# Patient Record
Sex: Male | Born: 1970 | Hispanic: No | Marital: Single | Smoking: Current some day smoker
Health system: Southern US, Community
[De-identification: ages and names within clinical notes are randomized; demographics above are authoritative.]

## PROBLEM LIST (undated history)

## (undated) DIAGNOSIS — A0100 Typhoid fever, unspecified: Secondary | ICD-10-CM

## (undated) DIAGNOSIS — B20 Human immunodeficiency virus [HIV] disease: Secondary | ICD-10-CM

## (undated) DIAGNOSIS — S66929A Laceration of unspecified muscle, fascia and tendon at wrist and hand level, unspecified hand, initial encounter: Secondary | ICD-10-CM

## (undated) DIAGNOSIS — J189 Pneumonia, unspecified organism: Secondary | ICD-10-CM

## (undated) DIAGNOSIS — S61419A Laceration without foreign body of unspecified hand, initial encounter: Secondary | ICD-10-CM

## (undated) DIAGNOSIS — I209 Angina pectoris, unspecified: Secondary | ICD-10-CM

## (undated) DIAGNOSIS — Z21 Asymptomatic human immunodeficiency virus [HIV] infection status: Secondary | ICD-10-CM

## (undated) HISTORY — DX: Human immunodeficiency virus (HIV) disease: B20

## (undated) HISTORY — DX: Asymptomatic human immunodeficiency virus (hiv) infection status: Z21

---

## 2000-12-22 DIAGNOSIS — S61419A Laceration without foreign body of unspecified hand, initial encounter: Secondary | ICD-10-CM

## 2000-12-22 HISTORY — DX: Laceration without foreign body of unspecified hand, initial encounter: S61.419A

## 2011-10-27 ENCOUNTER — Other Ambulatory Visit (HOSPITAL_COMMUNITY): Payer: Self-pay | Admitting: Respiratory Therapy

## 2011-10-27 ENCOUNTER — Emergency Department (HOSPITAL_COMMUNITY): Payer: Medicaid Other

## 2011-10-27 ENCOUNTER — Encounter: Payer: Self-pay | Admitting: Emergency Medicine

## 2011-10-27 ENCOUNTER — Inpatient Hospital Stay (HOSPITAL_COMMUNITY)
Admission: EM | Admit: 2011-10-27 | Discharge: 2011-11-07 | DRG: 974 | Disposition: A | Discharge status: Home / Self Care | Payer: Medicaid Other | Attending: Internal Medicine | Admitting: Internal Medicine

## 2011-10-27 DIAGNOSIS — J80 Acute respiratory distress syndrome: Secondary | ICD-10-CM | POA: Diagnosis present

## 2011-10-27 DIAGNOSIS — B20 Human immunodeficiency virus [HIV] disease: Principal | ICD-10-CM | POA: Diagnosis present

## 2011-10-27 DIAGNOSIS — E871 Hypo-osmolality and hyponatremia: Secondary | ICD-10-CM | POA: Diagnosis present

## 2011-10-27 DIAGNOSIS — J9600 Acute respiratory failure, unspecified whether with hypoxia or hypercapnia: Secondary | ICD-10-CM | POA: Diagnosis present

## 2011-10-27 DIAGNOSIS — R197 Diarrhea, unspecified: Secondary | ICD-10-CM | POA: Diagnosis not present

## 2011-10-27 DIAGNOSIS — R509 Fever, unspecified: Secondary | ICD-10-CM

## 2011-10-27 DIAGNOSIS — R112 Nausea with vomiting, unspecified: Secondary | ICD-10-CM | POA: Diagnosis present

## 2011-10-27 DIAGNOSIS — J13 Pneumonia due to Streptococcus pneumoniae: Secondary | ICD-10-CM

## 2011-10-27 DIAGNOSIS — B59 Pneumocystosis: Secondary | ICD-10-CM

## 2011-10-27 DIAGNOSIS — R6520 Severe sepsis without septic shock: Secondary | ICD-10-CM

## 2011-10-27 DIAGNOSIS — R6521 Severe sepsis with septic shock: Secondary | ICD-10-CM

## 2011-10-27 DIAGNOSIS — I959 Hypotension, unspecified: Secondary | ICD-10-CM | POA: Diagnosis present

## 2011-10-27 DIAGNOSIS — A419 Sepsis, unspecified organism: Secondary | ICD-10-CM

## 2011-10-27 DIAGNOSIS — J189 Pneumonia, unspecified organism: Secondary | ICD-10-CM

## 2011-10-27 DIAGNOSIS — E46 Unspecified protein-calorie malnutrition: Secondary | ICD-10-CM | POA: Diagnosis present

## 2011-10-27 HISTORY — DX: Typhoid fever, unspecified: A01.00

## 2011-10-27 HISTORY — DX: Angina pectoris, unspecified: I20.9

## 2011-10-27 HISTORY — DX: Laceration without foreign body of unspecified hand, initial encounter: S61.419A

## 2011-10-27 HISTORY — DX: Pneumonia, unspecified organism: J18.9

## 2011-10-27 HISTORY — PX: OTHER SURGICAL HISTORY: SHX169

## 2011-10-27 HISTORY — DX: Laceration of unspecified muscle, fascia and tendon at wrist and hand level, unspecified hand, initial encounter: S66.929A

## 2011-10-27 LAB — POCT I-STAT 3, ART BLOOD GAS (G3+)
Acid-base deficit: 9 mmol/L — ABNORMAL HIGH (ref 0.0–2.0)
Bicarbonate: 18 mEq/L — ABNORMAL LOW (ref 20.0–24.0)
Bicarbonate: 18 mEq/L — ABNORMAL LOW (ref 20.0–24.0)
Bicarbonate: 17.3 mEq/L — ABNORMAL LOW (ref 20.0–24.0)
Patient temperature: 97.8
TCO2: 20 mmol/L (ref 0–100)
TCO2: 19 mmol/L (ref 0–100)
TCO2: 18 mmol/L (ref 0–100)
pCO2 arterial: 47.9 mmHg — ABNORMAL HIGH (ref 35.0–45.0)
pCO2 arterial: 38.7 mmHg (ref 35.0–45.0)
pH, Arterial: 7.364 (ref 7.350–7.450)
pH, Arterial: 7.206 — ABNORMAL LOW (ref 7.350–7.450)
pH, Arterial: 7.274 — ABNORMAL LOW (ref 7.350–7.450)
pO2, Arterial: 85 mmHg (ref 80.0–100.0)
pO2, Arterial: 105 mmHg — ABNORMAL HIGH (ref 80.0–100.0)
pO2, Arterial: 94 mmHg (ref 80.0–100.0)

## 2011-10-27 LAB — URINALYSIS, ROUTINE W REFLEX MICROSCOPIC
Bilirubin Urine: NEGATIVE
Glucose, UA: NEGATIVE mg/dL
Hgb urine dipstick: NEGATIVE
Ketones, ur: NEGATIVE mg/dL
Leukocytes, UA: NEGATIVE
Nitrite: NEGATIVE
Protein, ur: 30 mg/dL — AB
Specific Gravity, Urine: 1.011 (ref 1.005–1.030)
Urobilinogen, UA: 0.2 mg/dL (ref 0.0–1.0)
pH: 6 (ref 5.0–8.0)
pH: 6 (ref 5.0–8.0)

## 2011-10-27 LAB — BASIC METABOLIC PANEL
BUN: 6 mg/dL (ref 6–23)
CO2: 21 mEq/L (ref 19–32)
Chloride: 110 mEq/L (ref 96–112)
Chloride: 104 mEq/L (ref 96–112)
Creatinine, Ser: 0.6 mg/dL (ref 0.50–1.35)
Creatinine, Ser: 0.43 mg/dL — ABNORMAL LOW (ref 0.50–1.35)
GFR calc Af Amer: >90 mL/min (ref >90)
GFR calc non Af Amer: >90 mL/min (ref >90)
Glucose, Bld: 306 mg/dL — ABNORMAL HIGH (ref 70–99)
Potassium: 3.7 mEq/L (ref 3.5–5.1)
Potassium: 4.4 mEq/L (ref 3.5–5.1)

## 2011-10-27 LAB — LACTIC ACID, PLASMA: Lactic Acid, Venous: 0.8 mmol/L (ref 0.5–2.2)

## 2011-10-27 LAB — DIFFERENTIAL
Basophils Absolute: 0 10*3/uL (ref 0.0–0.1)
Basophils Relative: 0 % (ref 0–1)
Eosinophils Absolute: 0 10*3/uL (ref 0.0–0.7)
Lymphocytes Relative: 5 % — ABNORMAL LOW (ref 12–46)
Monocytes Absolute: 0.1 10*3/uL (ref 0.1–1.0)
Monocytes Relative: 3 % (ref 3–12)
Neutro Abs: 5.6 10*3/uL (ref 1.7–7.7)
Neutrophils Relative %: 88 % — ABNORMAL HIGH (ref 43–77)
Neutrophils Relative %: 94 % — ABNORMAL HIGH (ref 43–77)

## 2011-10-27 LAB — GLUCOSE, CAPILLARY
Glucose-Capillary: 99 mg/dL (ref 70–99)
Glucose-Capillary: 135 mg/dL — ABNORMAL HIGH (ref 70–99)

## 2011-10-27 LAB — COMPREHENSIVE METABOLIC PANEL
Albumin: 1.6 g/dL — ABNORMAL LOW (ref 3.5–5.2)
Alkaline Phosphatase: 60 U/L (ref 39–117)
BUN: 16 mg/dL (ref 6–23)
Creatinine, Ser: 0.82 mg/dL (ref 0.50–1.35)
Potassium: 4.4 mEq/L (ref 3.5–5.1)
Total Protein: 7.7 g/dL (ref 6.0–8.3)

## 2011-10-27 LAB — CARBOXYHEMOGLOBIN
Carboxyhemoglobin: 0.7 % (ref 0.5–1.5)
O2 Saturation: 76.8 %

## 2011-10-27 LAB — OSMOLALITY: Osmolality: 284 mOsm/kg (ref 275–300)

## 2011-10-27 LAB — HIV ANTIBODY (ROUTINE TESTING W REFLEX): HIV: REACTIVE — AB

## 2011-10-27 LAB — APTT: aPTT: 34 seconds (ref 24–37)

## 2011-10-27 LAB — CBC
HCT: 27.9 % — ABNORMAL LOW (ref 39.0–52.0)
Hemoglobin: 11.5 g/dL — ABNORMAL LOW (ref 13.0–17.0)
MCH: 31.2 pg (ref 26.0–34.0)
MCHC: 35.9 g/dL (ref 30.0–36.0)
RDW: 13.5 % (ref 11.5–15.5)
WBC: 6 10*3/uL (ref 4.0–10.5)

## 2011-10-27 LAB — URINE MICROSCOPIC-ADD ON

## 2011-10-27 LAB — PROTIME-INR: Prothrombin Time: 19.7 seconds — ABNORMAL HIGH (ref 11.6–15.2)

## 2011-10-27 LAB — MRSA PCR SCREENING: MRSA by PCR: NEGATIVE

## 2011-10-27 LAB — PROCALCITONIN: Procalcitonin: 5.01 ng/mL

## 2011-10-27 LAB — STREP PNEUMONIAE URINARY ANTIGEN: Strep Pneumo Urinary Antigen: NEGATIVE

## 2011-10-27 LAB — PNEUMOCYSTIS JIROVECI SMEAR BY DFA: Pneumocystis jiroveci Ag: POSITIVE

## 2011-10-27 MED ORDER — BIOTENE DRY MOUTH MT LIQD
15.0000 mL | Freq: Four times a day (QID) | OROMUCOSAL | Status: DC
  Administered 2011-10-27 – 2011-10-30 (×14): 15 mL via OROMUCOSAL

## 2011-10-27 MED ORDER — MIDAZOLAM HCL 2 MG/2ML IJ SOLN
INTRAMUSCULAR | Status: AC
  Administered 2011-10-27: 2 mg via INTRAVENOUS
  Filled 2011-10-27: qty 4

## 2011-10-27 MED ORDER — PANTOPRAZOLE SODIUM 40 MG IV SOLR
40.0000 mg | Freq: Every day | INTRAVENOUS | Status: DC
  Administered 2011-10-27 – 2011-10-28 (×2): 40 mg via INTRAVENOUS
  Filled 2011-10-27 (×3): qty 40

## 2011-10-27 MED ORDER — VECURONIUM BROMIDE 10 MG IV SOLR
0.8000 ug/kg/min | INTRAVENOUS | Status: DC
  Administered 2011-10-27: 0.824 ug/kg/min via INTRAVENOUS
  Filled 2011-10-27: qty 100

## 2011-10-27 MED ORDER — FOLIC ACID 5 MG/ML IJ SOLN
1.0000 mg | Freq: Every day | INTRAMUSCULAR | Status: DC
  Administered 2011-10-27 – 2011-10-30 (×4): 1 mg via INTRAVENOUS
  Filled 2011-10-27 (×4): qty 0.2

## 2011-10-27 MED ORDER — HEPARIN SODIUM (PORCINE) 5000 UNIT/ML IJ SOLN: 5000.0000 [IU] | Freq: Three times a day (TID) | INTRAMUSCULAR | Status: DC

## 2011-10-27 MED ORDER — DOBUTAMINE-DEXTROSE 2-5 MG/ML-% IV SOLN
2.5000 ug/kg/min | INTRAVENOUS | Status: DC | PRN
  Filled 2011-10-27: qty 250

## 2011-10-27 MED ORDER — ACETAMINOPHEN 325 MG PO TABS
650.0000 mg | ORAL_TABLET | Freq: Once | ORAL | Status: AC
  Administered 2011-10-27: 650 mg via ORAL
  Filled 2011-10-27: qty 2

## 2011-10-27 MED ORDER — PIPERACILLIN-TAZOBACTAM 3.375 G IVPB
3.3750 g | Freq: Once | INTRAVENOUS | Status: AC
  Administered 2011-10-27: 3.375 g via INTRAVENOUS
  Filled 2011-10-27: qty 50

## 2011-10-27 MED ORDER — NOREPINEPHRINE BITARTRATE 1 MG/ML IJ SOLN
2.0000 ug/min | INTRAMUSCULAR | Status: DC | PRN
  Administered 2011-10-27: 10 ug/min via INTRAVENOUS
  Administered 2011-10-27: 8 ug/min via INTRAVENOUS
  Administered 2011-10-27: 5 ug/min via INTRAVENOUS
  Administered 2011-10-27: 10 ug/min via INTRAVENOUS
  Filled 2011-10-27: qty 4

## 2011-10-27 MED ORDER — SODIUM CHLORIDE 0.9 % IV SOLN
10.0000 ug/h | Freq: Once | INTRAVENOUS | Status: DC
  Filled 2011-10-27: qty 50

## 2011-10-27 MED ORDER — SODIUM CHLORIDE 0.9 % IV SOLN
250.0000 mL | INTRAVENOUS | Status: DC | PRN
  Administered 2011-10-27: 500 mL via INTRAVENOUS

## 2011-10-27 MED ORDER — METHYLPREDNISOLONE SODIUM SUCC 40 MG IJ SOLR
40.0000 mg | Freq: Two times a day (BID) | INTRAMUSCULAR | Status: DC
  Administered 2011-10-27: 40 mg via INTRAVENOUS
  Filled 2011-10-27 (×2): qty 1

## 2011-10-27 MED ORDER — SODIUM CHLORIDE 0.9 % IV SOLN
Freq: Once | INTRAVENOUS | Status: AC
  Administered 2011-10-27 (×7): via INTRAVENOUS

## 2011-10-27 MED ORDER — HEPARIN SODIUM (PORCINE) 5000 UNIT/ML IJ SOLN
5000.0000 [IU] | Freq: Three times a day (TID) | INTRAMUSCULAR | Status: DC
  Administered 2011-10-27 – 2011-11-07 (×34): 5000 [IU] via SUBCUTANEOUS
  Filled 2011-10-27 (×37): qty 1

## 2011-10-27 MED ORDER — VANCOMYCIN HCL IN DEXTROSE 1-5 GM/200ML-% IV SOLN
1000.0000 mg | Freq: Once | INTRAVENOUS | Status: AC
  Administered 2011-10-27: 1000 mg via INTRAVENOUS
  Filled 2011-10-27: qty 200

## 2011-10-27 MED ORDER — ROCURONIUM BROMIDE 50 MG/5ML IV SOLN
INTRAVENOUS | Status: AC
  Filled 2011-10-27: qty 2

## 2011-10-27 MED ORDER — NOREPINEPHRINE BITARTRATE 1 MG/ML IJ SOLN
2.0000 ug/min | INTRAVENOUS | Status: DC
  Filled 2011-10-27: qty 16

## 2011-10-27 MED ORDER — DOBUTAMINE HCL 250 MG/20ML IV SOLN
2.5000 ug/kg/min | INTRAVENOUS | Status: DC
  Filled 2011-10-27: qty 80

## 2011-10-27 MED ORDER — SODIUM CHLORIDE 0.9 % IV SOLN
1.0000 mg/h | Freq: Once | INTRAVENOUS | Status: AC
  Administered 2011-10-28: 2 mg/h via INTRAVENOUS
  Filled 2011-10-27 (×2): qty 10

## 2011-10-27 MED ORDER — VECURONIUM BROMIDE 10 MG IV SOLR
INTRAVENOUS | Status: AC
  Administered 2011-10-27: 8 mg via INTRAVENOUS
  Filled 2011-10-27: qty 10

## 2011-10-27 MED ORDER — ETOMIDATE 2 MG/ML IV SOLN
INTRAVENOUS | Status: AC
  Administered 2011-10-27: 20 mg via INTRAVENOUS
  Filled 2011-10-27: qty 20

## 2011-10-27 MED ORDER — DEXTROSE 5 % IV SOLN
INTRAVENOUS | Status: AC
  Filled 2011-10-27: qty 250

## 2011-10-27 MED ORDER — LIDOCAINE HCL (CARDIAC) 20 MG/ML IV SOLN
INTRAVENOUS | Status: AC
  Filled 2011-10-27: qty 5

## 2011-10-27 MED ORDER — SODIUM CHLORIDE 0.9 % IV SOLN
INTRAVENOUS | Status: DC
  Administered 2011-10-27 – 2011-10-29 (×3): via INTRAVENOUS

## 2011-10-27 MED ORDER — SULFAMETHOXAZOLE-TRIMETHOPRIM 400-80 MG/5ML IV SOLN
320.0000 mg | Freq: Three times a day (TID) | INTRAVENOUS | Status: DC
  Administered 2011-10-27 – 2011-10-30 (×10): 320 mg via INTRAVENOUS
  Filled 2011-10-27 (×15): qty 20

## 2011-10-27 MED ORDER — SODIUM CHLORIDE 0.9 % IJ SOLN
INTRAMUSCULAR | Status: AC
  Filled 2011-10-27: qty 10

## 2011-10-27 MED ORDER — NOREPINEPHRINE BITARTRATE 1 MG/ML IJ SOLN
2.0000 ug/min | INTRAVENOUS | Status: DC
  Administered 2011-10-27: 8 ug/min via INTRAVENOUS
  Filled 2011-10-27: qty 4

## 2011-10-27 MED ORDER — NOREPINEPHRINE BITARTRATE 1 MG/ML IJ SOLN
2.0000 ug/min | INTRAVENOUS | Status: DC
  Administered 2011-10-27: 10 ug/min

## 2011-10-27 MED ORDER — LEVOFLOXACIN IN D5W 750 MG/150ML IV SOLN
750.0000 mg | INTRAVENOUS | Status: DC
  Administered 2011-10-27 – 2011-10-28 (×2): 750 mg via INTRAVENOUS
  Filled 2011-10-27 (×2): qty 150

## 2011-10-27 MED ORDER — SULFAMETHOXAZOLE-TRIMETHOPRIM 400-80 MG/5ML IV SOLN: 5.0000 mg/kg/d | Freq: Three times a day (TID) | INTRAVENOUS | Status: DC

## 2011-10-27 MED ORDER — VANCOMYCIN HCL IN DEXTROSE 1-5 GM/200ML-% IV SOLN
1000.0000 mg | Freq: Three times a day (TID) | INTRAVENOUS | Status: DC
  Administered 2011-10-27 – 2011-10-29 (×6): 1000 mg via INTRAVENOUS
  Filled 2011-10-27 (×7): qty 200

## 2011-10-27 MED ORDER — NOREPINEPHRINE BITARTRATE 1 MG/ML IJ SOLN
2.0000 ug/min | Freq: Once | INTRAMUSCULAR | Status: AC
  Administered 2011-10-27: 3 ug/min via INTRAVENOUS
  Filled 2011-10-27: qty 4

## 2011-10-27 MED ORDER — HYDROCORTISONE SOD SUCCINATE 1000 MG IJ SOLR
50.0000 mg | Freq: Once | INTRAMUSCULAR | Status: DC
Start: 2011-10-27 — End: 2011-10-27
  Filled 2011-10-27 (×2): qty 50

## 2011-10-27 MED ORDER — INSULIN ASPART 100 UNIT/ML ~~LOC~~ SOLN
0.0000 [IU] | SUBCUTANEOUS | Status: DC
  Administered 2011-10-27: 1 [IU] via SUBCUTANEOUS
  Administered 2011-10-27: 5 [IU] via SUBCUTANEOUS
  Administered 2011-10-28: 1 [IU] via SUBCUTANEOUS
  Administered 2011-10-28: 3 [IU] via SUBCUTANEOUS
  Administered 2011-10-28: 2 [IU] via SUBCUTANEOUS
  Administered 2011-10-28 (×2): 3 [IU] via SUBCUTANEOUS
  Administered 2011-10-28: 5 [IU] via SUBCUTANEOUS
  Administered 2011-10-29 (×2): 2 [IU] via SUBCUTANEOUS
  Administered 2011-10-29: 1 [IU] via SUBCUTANEOUS
  Filled 2011-10-27: qty 3

## 2011-10-27 MED ORDER — CHLORHEXIDINE GLUCONATE 0.12 % MT SOLN
15.0000 mL | Freq: Two times a day (BID) | OROMUCOSAL | Status: DC
  Administered 2011-10-27 – 2011-10-30 (×7): 15 mL via OROMUCOSAL
  Filled 2011-10-27 (×10): qty 15

## 2011-10-27 MED ORDER — HYDROCORTISONE SOD SUCCINATE 1000 MG IJ SOLR
50.0000 mg | Freq: Four times a day (QID) | INTRAMUSCULAR | Status: DC
  Filled 2011-10-27 (×2): qty 50

## 2011-10-27 MED ORDER — FENTANYL CITRATE 0.05 MG/ML IJ SOLN
INTRAMUSCULAR | Status: AC
  Administered 2011-10-27: 100 ug via INTRAVENOUS
  Filled 2011-10-27: qty 2

## 2011-10-27 MED ORDER — SODIUM CHLORIDE 0.9 % IJ SOLN
INTRAMUSCULAR | Status: AC
  Administered 2011-10-27: 10 mL
  Filled 2011-10-27: qty 10

## 2011-10-27 MED ORDER — METHYLPREDNISOLONE SODIUM SUCC 40 MG IJ SOLR
40.0000 mg | Freq: Two times a day (BID) | INTRAMUSCULAR | Status: DC
  Administered 2011-10-27 – 2011-10-30 (×6): 40 mg via INTRAVENOUS
  Filled 2011-10-27 (×9): qty 1

## 2011-10-27 MED ORDER — M.V.I. ADULT IV INJ
10.0000 mL | Freq: Once | INTRAVENOUS | Status: AC
  Administered 2011-10-27: 10 mL via INTRAVENOUS
  Filled 2011-10-27: qty 10

## 2011-10-27 MED ORDER — THIAMINE HCL 100 MG/ML IJ SOLN
100.0000 mg | Freq: Every day | INTRAMUSCULAR | Status: DC
  Administered 2011-10-27 – 2011-10-30 (×4): 100 mg via INTRAVENOUS
  Filled 2011-10-27 (×4): qty 1

## 2011-10-27 MED ORDER — NOREPINEPHRINE BITARTRATE 1 MG/ML IJ SOLN
INTRAMUSCULAR | Status: AC
  Filled 2011-10-27: qty 4

## 2011-10-27 MED ORDER — SUCCINYLCHOLINE CHLORIDE 20 MG/ML IJ SOLN
INTRAMUSCULAR | Status: AC
  Administered 2011-10-27: 100 mg via INTRAVENOUS
  Filled 2011-10-27: qty 5

## 2011-10-27 MED ORDER — PIPERACILLIN-TAZOBACTAM 3.375 G IVPB 30 MIN
3.3750 g | Freq: Three times a day (TID) | INTRAVENOUS | Status: DC
  Administered 2011-10-27 – 2011-10-29 (×6): 3.375 g via INTRAVENOUS
  Filled 2011-10-27 (×7): qty 50

## 2011-10-27 NOTE — ED Notes (Signed)
Called for bedside interpreter for family

## 2011-10-27 NOTE — ED Provider Notes (Signed)
History     CSN: [id number] Arrival date & time: 10/27/2011  3:10 AM   First MD Initiated Contact with Patient 10/27/11 [phone]      Chief Complaint  Patient presents with  . Fever    with hypotension     HPI 40 year old gentleman presents to the emergency department with complaint of shortness of breath and fever history obtained from patient and significant other with some interpreter assistance. Patient and friend do not speak any [place]. Patient reports he's been feeling unwell for the last 2 months with constant diarrhea, abdominal pain anorexia weight loss and night sweats. Patient was seen at an urgent care and started on Cipro, Flagyl Imodium and nystatin suspension. Patient does not carry a diagnosis of HIV. Patient reports he's been on medicines for about 7-10 days and has been getting worse. Fever and shortness of breath started 2-3 days ago. [place]. He denies any previous medical problems  History reviewed. No pertinent past medical history.  History reviewed. No pertinent past surgical history.  History reviewed. No pertinent family history.  History  Substance Use Topics  . Smoking status: Not on file  . Smokeless tobacco: Not on file  . Alcohol Use: Not on file      Review of Systems  Unable to perform ROS secondary to language barrier  Allergies  Review of patient's allergies indicates no known allergies.  Home Medications   Current Outpatient Rx  Name Route Sig Dispense Refill  . CIPROFLOXACIN HCL 500 MG PO TABS Oral Take 500 mg by mouth 2 (two) times daily.      Marland Kitchen FLUCONAZOLE 150 MG PO TABS Oral Take 150 mg by mouth once.      Marland Kitchen LOPERAMIDE HCL 2 MG PO CAPS Oral Take 2 mg by mouth 4 (four) times daily as needed.      Marland Kitchen METRONIDAZOLE 250 MG PO TABS Oral Take 250 mg by mouth 2 (two) times daily.      . NYSTATIN 100000 UNIT/ML MT SUSP Oral Take 500,000 Units by mouth 4 (four) times daily.      Marland Kitchen RANITIDINE HCL 150 MG PO  TABS Oral Take 150 mg by mouth 2 (two) times daily.        BP 107/61  Pulse 102  Temp(Src) 97.8 F (36.6 C) (Oral)  Resp 20  SpO2 95%  Physical Exam  Constitutional: He is oriented to person, place, and time. He appears distressed.       Patient underweight ill-appearing  HENT:  Head: Normocephalic and atraumatic.  Right Ear: External ear normal.  Left Ear: External ear normal.  Nose: Nose normal.  Mouth/Throat: No oropharyngeal exudate.       Mucus membranes are dry  Eyes: Conjunctivae and EOM are normal. Pupils are equal, round, and reactive to light. No scleral icterus.  Neck: Normal range of motion. Neck supple. No JVD present. No tracheal deviation present. No thyromegaly present.  Cardiovascular: Regular rhythm, normal heart sounds and intact distal pulses.  Exam reveals no gallop and no friction rub.   No murmur heard.      Tachycardia noted  Pulmonary/Chest: No stridor. He is in respiratory distress. He has no wheezes. He has no rales. He exhibits no tenderness.       Rhonchi throughout left greater than right patient with tachypnea and accessory muscle use  Abdominal: Soft. He exhibits no distension and no mass. There is tenderness. There is no rebound and no  guarding.       hyperactive bowel sounds with diffuse tenderness throughout  Musculoskeletal: Normal range of motion. He exhibits no edema and no tenderness.  Lymphadenopathy:    He has no cervical adenopathy.  Neurological: He is oriented to person, place, and time.  Skin: No rash noted. He is diaphoretic. No erythema. No pallor.       Diaphoresis    ED Course  Procedures (including critical care time)  Labs Reviewed  CBC - Abnormal; Notable for the following:    RBC 3.69 (*)    Hemoglobin 11.5 (*)    HCT 32.0 (*)    All other components within normal limits  DIFFERENTIAL - Abnormal; Notable for the following:    Neutrophils Relative 88 (*)    Lymphocytes Relative 10 (*)    All other components within  normal limits  COMPREHENSIVE METABOLIC PANEL - Abnormal; Notable for the following:    Sodium 125 (*)    Chloride 91 (*)    Glucose, Bld 100 (*)    Calcium 8.0 (*)    Albumin 1.6 (*)    AST 94 (*)    All other components within normal limits  URINALYSIS, ROUTINE W REFLEX MICROSCOPIC - Abnormal; Notable for the following:    Protein, ur 30 (*)    All other components within normal limits  CARDIAC PANEL(CRET KIN+CKTOT+MB+TROPI) - Abnormal; Notable for the following:    Troponin I 0.63 (*)    All other components within normal limits  POCT I-STAT 3, BLOOD GAS (G3+) - Abnormal; Notable for the following:    pCO2 arterial 31.5 (*)    Bicarbonate 18.0 (*)    Acid-base deficit 7.0 (*)    All other components within normal limits  LACTIC ACID, PLASMA  URINE MICROSCOPIC-ADD ON  CULTURE, BLOOD (ROUTINE X 2)  CULTURE, BLOOD (ROUTINE X 2)  URINE CULTURE  CLOSTRIDIUM DIFFICILE EIA  STOOL CULTURE  CULTURE, SPUTUM-ASSESSMENT  HIV ANTIBODY   Dg Chest Portable 1 View  10/27/2011  *RADIOLOGY REPORT*  Clinical Data: Low O2 saturation, shortness of breath.  PORTABLE CHEST - 1 VIEW  Comparison: None.  Findings: Patchy airspace opacity of the left greater than right lungs. Central vascular congestion.  Cardiomediastinal contours are within upper normal limits otherwise. No pleural effusion or pneumothorax.  No acute osseous abnormality.  IMPRESSION: Left greater than right airspace opacity; asymmetric edema versus infection.  Original Report Authenticated By: Waneta Martins, M.D.     1. Fever   2. Pneumonia   3. Hypotension   4. Diarrhea       MDM  40 year old male with signs of sepsis pneumonia hypotension hypoxia. Story is concerning for an immunocompromise state such as HIV or AIDS or disseminated TB. Patient with decompensation after fluids had stabilized his blood pressure was dropping his sats in the 70s. Patient was placed on the BiPAP. Critical care irritation and to admit. Discuss  with patient through the interpreter the possibility of needing to be intubated and put on the ventilator. HIV ordered and is still pending. Patient critically ill. Antibiotics started cultures pending. Patient is mentating and maintaining blood pressure after 7 L of fluid. On BiPAP saturations have improved to the high 90s but he is still tachypneic into the 40s. ICU has seen the patient and expects to admit and place central line        Olivia Mackie, MD 10/27/11 [phone]

## 2011-10-27 NOTE — Consult Note (Signed)
Infectious Diseases Initial Consultation  Reason for Consultation:  Newly diagnosed HIV with Acute Respiratory Failure/ARDS   HPI: Gerald Cox is a 40 y.o. male spanish speaking Hispanic Male who has resided in the [place] for the past 7 yrs. His HPI is unable to be obtained from the patient since he is intubated/sedated but per his medical record, he is not known to have prior medical problems. He reports 1 month history of shortness of breath with accompanied fever/chills/nightsweats/anorexia. Unclear if he has had weight loss. He also subscribes to having 2 month of watery diarrhea with associated abdominal pain. In the ED he was noted to be febrile to 102F,tachycardic with HR 100s,  tachypneic in RR in the 40s and hypoxic.  labs revealed hyponatremia of 125, WBc of 7 with left shift of 88%N. His CXR showed bilateral patchy infiltrates of L > R. Due to presenting with acute respiratory failure/sepsis he was started on broad-spectrum antibiotics with vanco, pip/tazo, and levofloxacin and 7L IVF. He still required vasopressors. Unclear if patient had carried a previous diagnosis of HIV, but his rapid HIV test was positive, thus TMP/SMX at 15mg /kg was started to empirically treat for Pneumocystis pneumonia. He was intubated and underwent bronchoscopy for which specimen sent for aerobic, fungal, AFB cultures and pneumocystis stain.   Past Medical History  Diagnosis Date  . No pertinent past medical history     Allergies: No Known Allergies  Current antibiotics: Levofloxacin 750mg  daily 11/05 TMP/SMX 320mg  Q8 11/05 Pip/tazo 3.375mg  Q8 11/05 vanco 1gm Q8 11/05 methylpred 40mg  BID 11/05  MEDICATIONS: have been reviewed  History  Substance Use Topics  . Smoking status: Current Everyday Smoker -- 1.0 packs/day  . Smokeless tobacco: Not on file  . Alcohol Use: 28.8 oz/week    48 Cans of beer per week    Family History  Problem Relation Age of Onset  . Hypertension Sister   .  Hypertension Mother     Review of Systems - unable to be obtained since patient is intubated and on paralytics  OBJECTIVE: Temp:  [97.8 F (36.6 C)-102.2 F (39 C)] 97.8 F (36.6 C) (11/05 0600) Pulse Rate:  [87-124] 94  (11/05 1615) Resp:  [20-35] 35  (11/05 1615) BP: (69-156)/(41-108) 117/88 mmHg (11/05 1200) SpO2:  [67 %-100 %] 91 % (11/05 1615) Arterial Line BP: (87-151)/(48-93) 91/51 mmHg (11/05 1615) FiO2 (%):  [0.7 %-100 %] 60 % (11/05 1200) Weight:  [61.689 kg (136 lb)-63.9 kg (140 lb 14 oz)] 140 lb 6.4 oz (63.685 kg) (11/05 1223) BP 117/88  Pulse 94  Temp(Src) 97.8 F (36.6 C) (Oral)  Resp 35  Wt 63.685 kg (140 lb 6.4 oz)  SpO2 91%  General Appearance:    Alert, cooperative, no distress, appears stated age  Head:    Normocephalic, without obvious abnormality, atraumatic  Eyes:    PERRL, conjunctiva/corneas clear,      Ears:    Normal TM's and external ear canals, both ears  Nose:   Nares normal, septum midline, mucosa normal, no drainage   or sinus tenderness  Throat:   Lips, mucosa normal but has airway protector in place  Neck:   Supple, symmetrical, trachea midline, no adenopathy;       thyroid:  No enlargement/tenderness/nodules; no carotid   bruit or JVD. Left IJ  Back:     Deferred, patient is supine, recently intubated  Lungs:     Tachypnic, course rhonchi bilaterally, no acc. Muscle use  Chest wall:    No  tenderness or deformity  Heart:    tachycardic, S1 and S2 normal, no murmur, rub   or gallop  Abdomen:     Soft, non-tender, bowel sounds active all four quadrants,    no masses, no organomegaly  Genitalia:    Normal male without lesion, discharge or tenderness  Rectal:    deferred  Extremities:   Extremities normal, atraumatic, no cyanosis or edema  Pulses:   2+ and symmetric all extremities  Skin:   Skin color, texture, turgor normal, no rashes or lesions  Lymph nodes:   Cervical, supraclavicular, and axillary nodes normal  Neurologic:   Deferred  since patient is on a paralytics   LABS:    Component Value Date/Time   WBC 6.0 10/27/2011 1050   RBC 3.15* 10/27/2011 1050   HGB 9.5* 10/27/2011 1050   HCT 27.9* 10/27/2011 1050   PLT 302 10/27/2011 1050   MCV 88.6 10/27/2011 1050   MCH 30.2 10/27/2011 1050   MCHC 34.1 10/27/2011 1050   RDW 13.5 10/27/2011 1050   LYMPHSABS 0.3* 10/27/2011 1050   MONOABS 0.1 10/27/2011 1050   EOSABS 0.0 10/27/2011 1050   BASOSABS 0.0 10/27/2011 1050       Component Value Date/Time   NA 136 10/27/2011 1050   K 3.7 10/27/2011 1050   CL 110 10/27/2011 1050   CO2 18* 10/27/2011 1050   GLUCOSE 102* 10/27/2011 1050   BUN 11 10/27/2011 1050   CREATININE 0.60 10/27/2011 1050   CALCIUM 6.2* 10/27/2011 1050   PROT 7.7 10/27/2011 0345   ALBUMIN 1.6* 10/27/2011 0345   AST 94* 10/27/2011 0345   ALT 21 10/27/2011 0345   ALKPHOS 60 10/27/2011 0345   BILITOT 0.3 10/27/2011 0345   GFRNONAA >90 10/27/2011 1050   GFRAA >90 10/27/2011 1050   HIV reactive 11/05 Urine strep pneumo negative MICRO: BAL 11/05 cx, afb, fungal-- pending IMAGING: Per my read, cxr 11/05 on admit, patchy bilateral infiltrates L> R, worsening after fluid rescucitation  Assessment/Plan:  40yo hispanic male presents with acute respiratory failure and also history of month long diarrhea, newly diagnosed HIV.  1) pulmonary disease : patient appears to have had a subacute process that tip over to acute respiratory distress, possibly secondary bacterial infection. Continue with HCAP and empiric pneumocystis treatment. Await results for BAL culture. Other pathogens to consider include mTB, and possibly endemic mycosis such as coccidiomycosis, histoplasmosis/blasto. Please send for quantiferon gold for mTb, and urine histo antigen. We will check with lab to see what tests are available for cocci/histo/blasto.   newly diagnosed HIV: please check for CD4 count, viral load, and HIV Genotyping, RPR, toxo, hepatitis B sAg, HBsAb, HBcAg, HCV antibodies.  diarrhea= please  send stool for cryptosporidium, isospora, and giardia/ and O X P   prolonged malaise/fever = this could also be a presentation of disseminated MAC. Please send mycobacterial blood cultures, ask lab to hold longer, stating looking for disseminated MAC.  We will look forward taking part in mr. doval-rivera's care.  Judyann Munson MD MPH Infectious diseases [phone]

## 2011-10-27 NOTE — ED Notes (Signed)
Patient with fever and hypotension, speaks no English, has been feeling poorly for two months.

## 2011-10-27 NOTE — Progress Notes (Deleted)
*  PRELIMINARY RESULTS* Echocardiogram 2D Echocardiogram has been performed.  Clide Deutscher RDCS 10/27/2011, 3:18 PM

## 2011-10-27 NOTE — Procedures (Signed)
Bronchoscopy Procedure Note Colbi Staubs [id number] 10-07-1971  Procedure: Bronchoscopy Indications: Diagnostic evaluation of the airways  Procedure Details Consent: Risks of procedure as well as the alternatives and risks of each were explained to the (patient/caregiver).  Consent for procedure obtained. Time Out: Verified patient identification, verified procedure, site/side was marked, verified correct patient position, special equipment/implants available, medications/allergies/relevent history reviewed, required imaging and test results available.  Performed  In preparation for procedure, patient was given 100% FiO2. Sedation: Benzodiazepines  Airway entered and the following bronchi were examined: RUL, RML, RLL, LUL, LLL and Bronchi.   Procedures performed: Brushings performed Bronchoscope removed.    Evaluation Hemodynamic Status: BP stable throughout; O2 sats: stable throughout Patient's Current Condition: stable Specimens:  Sent serosanguinous fluid Complications: No apparent complications Patient did tolerate procedure well.   Findings: 1. No no endobronhial lesions 2. No secretions at all 3. tolearted RML, lingula BAl , 50% yield slight cloudty 4. No KS lesions  Nelda Bucks. 10/27/2011

## 2011-10-27 NOTE — Procedures (Signed)
Arterial Catheter Insertion Procedure Note Gerald Cox [id number] May 01, 1971   Procedure: Insertion of Arterial Catheter  Indications: Blood pressure monitoring  Procedure Details Consent: Unable to obtain consent because of altered level of consciousness. Time Out: Verified patient identification, verified procedure, site/side was marked, verified correct patient position, special equipment/implants available, medications/allergies/relevent history reviewed, required imaging and test results available.  Performed  Maximum sterile technique was used including antiseptics, cap, gloves, gown, hand hygiene, mask and sheet. Skin prep: Chlorhexidine; local anesthetic administered 20 gauge catheter was inserted into right radial artery using the Seldinger technique.  Evaluation Blood flow good; BP tracing good. Complications: No apparent complications.   Gerald Cox 10/27/2011

## 2011-10-27 NOTE — Progress Notes (Signed)
eLink Physician-Brief Progress Note Patient Name: Gerald Cox DOB: [birth year] MRN: [id number]  Date of Service  10/27/2011   HPI/Events of Note   rn called. BAL from today shows PCP. D/w Bedside MD Dr Tyson Alias  eICU Interventions  Dc hydrocort Start IV solumedol Continue sulfa IV      [name] 10/27/2011, 9:41 PM

## 2011-10-27 NOTE — Procedures (Signed)
Central Venous Catheter Insertion Procedure Note Gerald Cox [id number] 1971-08-15  Procedure: Insertion of Central Venous Catheter Indications: Drug and/or fluid administration  Procedure Details Consent: Unable to obtain consent because of altered level of consciousness. Time Out: Verified patient identification, verified procedure, site/side was marked, verified correct patient position, special equipment/implants available, medications/allergies/relevent history reviewed, required imaging and test results available.  Performed  Maximum sterile technique was used including antiseptics, cap, gloves, gown, hand hygiene, mask and sheet. Skin prep: Chlorhexidine; local anesthetic administered A antimicrobial bonded/coated triple lumen catheter was placed in the left internal jugular vein using the Seldinger technique.  Evaluation Blood flow good Complications: No apparent complications Patient did tolerate procedure well. Chest X-ray ordered to verify placement.  CXR: pending.  Nelda Bucks 10/27/2011, 8:31 AM

## 2011-10-27 NOTE — H&P (Signed)
  40 yo [place]anish male with known hiv who presents with diarrhea and hypoxia. Despite 7 litres of fluid is hypotensive and is developing worsening hypoxia. PCCM aske to admit.

## 2011-10-27 NOTE — H&P (Signed)
Name: Gerald Cox MRN: [id number] DOB: 11-18-1971  DOS:   10/27/11     CRITICAL CARE MEDICINE ADMISSION / CONSULTATION NOTE  Referring Physician    Reason For Consult  Acute rsp failure     History Of Present Illness   Please note limited history available. No transcipts even. 40 yr old concner histroy HIV presents with 1 month SOB, outpt abx given  , failed. diostress to ED. Found hypoxic resp failure, intubated, shock, pressors required. No fevers. Bilateral infiltrates. unknwon cd4. diahrea noted ,. No abdo pain. No other history.  Pmh: may be HIV positive. In need of history further  History reviewed. No pertinent past surgical history.  Prior to Admission medications   Medication Sig Start Date End Date Taking? Authorizing Provider  ciprofloxacin (CIPRO) 500 MG tablet Take 500 mg by mouth 2 (two) times daily.      Historical Provider, MD  fluconazole (DIFLUCAN) 150 MG tablet Take 150 mg by mouth once.      Historical Provider, MD  loperamide (IMODIUM) 2 MG capsule Take 2 mg by mouth 4 (four) times daily as needed.      Historical Provider, MD  metroNIDAZOLE (FLAGYL) 250 MG tablet Take 250 mg by mouth 2 (two) times daily.      Historical Provider, MD  nystatin (MYCOSTATIN) 100000 UNIT/ML suspension Take 500,000 Units by mouth 4 (four) times daily.      Historical Provider, MD  ranitidine (ZANTAC) 150 MG tablet Take 150 mg by mouth 2 (two) times daily.      Historical Provider, MD    No Known Allergies  History reviewed. No pertinent family history.  Social History  does not have a smoking history on file. He does not have any smokeless tobacco history on file. His alcohol and drug histories not on file.  Review Of Systems  11 points review of systems is negative with an exception of listed in HPI.  Physical Examination  BP 109/63  Pulse 90  Temp(Src) 97.8 F (36.6 C) (Oral)  Resp 20  SpO2 95%  Neuro:  Sedated, intubated , int aggitation HEENT:  ETT ,. No  jvd, after 7 liters Heart:  Rrt s1 s 2 , no m Lungs:  Bilateral air entry, coarse Abdomen:  Soft, non tender, bowel sounds present Extremities:  Bowel sounds present  Labs  Results for orders placed during the hospital encounter of 10/27/11 (from the past 24 hour(s))  CBC     Status: Abnormal   Collection Time   10/27/11  3:45 AM      Component Value Range   WBC 7.0  4.0 - 10.5 (K/uL)   RBC 3.69 (*) 4.22 - 5.81 (MIL/uL)   Hemoglobin 11.5 (*) 13.0 - 17.0 (g/dL)   HCT 40.9 (*) 81.1 - 52.0 (%)   MCV 86.7  78.0 - 100.0 (fL)   MCH 31.2  26.0 - 34.0 (pg)   MCHC 35.9  30.0 - 36.0 (g/dL)   RDW 91.4  78.2 - 95.6 (%)   Platelets 354  150 - 400 (K/uL)  DIFFERENTIAL     Status: Abnormal   Collection Time   10/27/11  3:45 AM      Component Value Range   Neutrophils Relative 88 (*) 43 - 77 (%)   Neutro Abs 6.1  1.7 - 7.7 (K/uL)   Lymphocytes Relative 10 (*) 12 - 46 (%)   Lymphs Abs 0.7  0.7 - 4.0 (K/uL)   Monocytes Relative 3  3 - 12 (%)  Monocytes Absolute 0.2  0.1 - 1.0 (K/uL)   Eosinophils Relative 0  0 - 5 (%)   Eosinophils Absolute 0.0  0.0 - 0.7 (K/uL)   Basophils Relative 0  0 - 1 (%)   Basophils Absolute 0.0  0.0 - 0.1 (K/uL)  COMPREHENSIVE METABOLIC PANEL     Status: Abnormal   Collection Time   10/27/11  3:45 AM      Component Value Range   Sodium 125 (*) 135 - 145 (mEq/L)   Potassium 4.4  3.5 - 5.1 (mEq/L)   Chloride 91 (*) 96 - 112 (mEq/L)   CO2 22  19 - 32 (mEq/L)   Glucose, Bld 100 (*) 70 - 99 (mg/dL)   BUN 16  6 - 23 (mg/dL)   Creatinine, Ser 1.61  0.50 - 1.35 (mg/dL)   Calcium 8.0 (*) 8.4 - 10.5 (mg/dL)   Total Protein 7.7  6.0 - 8.3 (g/dL)   Albumin 1.6 (*) 3.5 - 5.2 (g/dL)   AST 94 (*) 0 - 37 (U/L)   ALT 21  0 - 53 (U/L)   Alkaline Phosphatase 60  39 - 117 (U/L)   Total Bilirubin 0.3  0.3 - 1.2 (mg/dL)   GFR calc non Af Amer >90  >90 (mL/min)   GFR calc Af Amer >90  >90 (mL/min)  LACTIC ACID, PLASMA     Status: Normal   Collection Time   10/27/11  4:20 AM       Component Value Range   Lactic Acid, Venous 1.1  0.5 - 2.2 (mmol/L)  CARDIAC PANEL(CRET KIN+CKTOT+MB+TROPI)     Status: Abnormal   Collection Time   10/27/11  4:34 AM      Component Value Range   Total CK 198  7 - 232 (U/L)   CK, MB 2.9  0.3 - 4.0 (ng/mL)   Troponin I 0.63 (*) <0.30 (ng/mL)   Relative Index 1.5  0.0 - 2.5   POCT I-STAT 3, BLOOD GAS (G3+)     Status: Abnormal   Collection Time   10/27/11  6:07 AM      Component Value Range   pH, Arterial 7.364  7.350 - 7.450    pCO2 arterial 31.5 (*) 35.0 - 45.0 (mmHg)   pO2, Arterial 85.0  80.0 - 100.0 (mmHg)   Bicarbonate 18.0 (*) 20.0 - 24.0 (mEq/L)   TCO2 19  0 - 100 (mmol/L)   O2 Saturation 96.0     Acid-base deficit 7.0 (*) 0.0 - 2.0 (mmol/L)   Patient temperature 97.8 F     Collection site RADIAL, ALLEN'S TEST ACCEPTABLE     Drawn by RT     Sample type ARTERIAL    URINALYSIS, ROUTINE W REFLEX MICROSCOPIC     Status: Abnormal   Collection Time   10/27/11  6:20 AM      Component Value Range   Color, Urine YELLOW  YELLOW    Appearance CLEAR  CLEAR    Specific Gravity, Urine 1.011  1.005 - 1.030    pH 6.0  5.0 - 8.0    Glucose, UA NEGATIVE  NEGATIVE (mg/dL)   Hgb urine dipstick NEGATIVE  NEGATIVE    Bilirubin Urine NEGATIVE  NEGATIVE    Ketones, ur NEGATIVE  NEGATIVE (mg/dL)   Protein, ur 30 (*) NEGATIVE (mg/dL)   Urobilinogen, UA 0.2  0.0 - 1.0 (mg/dL)   Nitrite NEGATIVE  NEGATIVE    Leukocytes, UA NEGATIVE  NEGATIVE   URINE MICROSCOPIC-ADD ON  Status: Normal   Collection Time   10/27/11  6:20 AM      Component Value Range   WBC, UA 0-2  <3 (WBC/hpf)    Imaging  Dg Chest Portable 1 View  10/27/2011  *RADIOLOGY REPORT*  Clinical Data: Low O2 saturation, shortness of breath.  PORTABLE CHEST - 1 VIEW  Comparison: None.  Findings: Patchy airspace opacity of the left greater than right lungs. Central vascular congestion.  Cardiomediastinal contours are within upper normal limits otherwise. No pleural effusion or  pneumothorax.  No acute osseous abnormality.  IMPRESSION: Left greater than right airspace opacity; asymmetric edema versus infection.  Original Report Authenticated By: Waneta Martins, M.D.    Assessment Patient Active Hospital Problem List: HIV (human immunodeficiency virus infection) (10/27/2011)   Assessment: assess hiv , cd4 Treat empiric pcp, see p   Plan: treat above Bc, sputum, not a poctur of TB, wil need more history Sepsis associated hypotension (10/27/2011)   Assessment: likley septic shock, r/o hypovolemia   Plan: EGDT, on pressors, assess cvp after line, svo2, stress roids in form pcp empiric ecg needed May  Need echo, assess valvular disease Acute respiratory failure (10/27/2011)   Plan: required emergent intubation, ARDS to goal 76 cc/kg, abg stat, pcxr post intubation, required [paralysis, severe dyschrony   Hyponatremia (10/27/2011)   Plan: r/o siadh, hypovolemia, assess urine na, osm , NACL bolus , , cvp chem in pm  Septic shock (10/27/2011)   Assessment: EGDT, ECHo, volume, cvp, stress steroids, high risk AI   Plan above ARDS (adult respiratory distress syndrome) (10/27/2011)   Assessment: ARDS proto, to goal 6 cc/kg, permissive hypercapnia likley needed, abg last reviewed, rate 30, may need further rat eincrease    Plan: Pneumonia (10/27/2011)   Assessment:    Plan: treat CAP, empiric PCP, steroids, BC, consider early bronch, not a picture of TB, assess quantiferrion, vanc  Crit ill 95 min     Nelda Bucks., MD 10/27/2011, 8:47 AM

## 2011-10-27 NOTE — Progress Notes (Signed)
*  PRELIMINARY RESULTS* Echocardiogram 2D Echocardiogram has been performed.  Clide Deutscher RDCS 10/27/2011, 3:17 PM

## 2011-10-27 NOTE — ED Notes (Signed)
Patient becoming hypoxic, desatorating on NRB.  Patient coughing with thick sputum being coughed up.  Patient placed on BIPAP.

## 2011-10-27 NOTE — Progress Notes (Signed)
ANTIBIOTIC CONSULT NOTE - INITIAL  Pharmacy Consult for Vanc/Zosyn/Levaquin/Septra (D#1)  Indication:  PNA/PCP PNA/sepsis  No Known Allergies  Patient Measurements:  wt = 62 kg  ht = ?5'8   Vital Signs: Temp: 97.8 F (36.6 C) (11/05 0600) Temp src: Oral (11/05 0600) BP: 156/81 mmHg (11/05 0859) Pulse Rate: 103  (11/05 0859) Intake/Output from previous day: 11/04 0701 - 11/05 0700 In: 7000 [I.V.:7000] Out: -  Intake/Output from this shift:    Labs:  Basename 10/27/11 0345  WBC 7.0  HGB 11.5*  PLT 354  LABCREA --  CREATININE 0.82   CrCl is unknown because there is no height on file for the current visit. No results found for this basename: VANCOTROUGH:2,VANCOPEAK:2,VANCORANDOM:2,GENTTROUGH:2,GENTPEAK:2,GENTRANDOM:2,TOBRATROUGH:2,TOBRAPEAK:2,TOBRARND:2,AMIKACINPEAK:2,AMIKACINTROU:2,AMIKACIN:2, in the last 72 hours   Microbiology: No results found for this or any previous visit (from the past 720 hour(s)).  Medical History: ? +HIV, limited history available    Assessment: 30 YOM with possible hx of HIV presented with SOB, respiratory failure, and shock to start empiric Vanc, Zosyn, Levaquin for PNA/sepsis and Septra for possible PCP PNA.  Noted patient failed outpatient Flagyl, Cipro and fluconazole.  Noted patient received vanc 1gm and Zosyn 3.375gm IV in the ED around 0530.  Goal of Therapy:  Vanc trough 15-20 mcg/mL  Plan:  1.  Vancomycin 1gm IV Q8H, next dose at 1300 today. 2.  Zosyn 3.375gm IV Q8H, next dose at 1300 today. 3.  Levaquin 750mg  IV Q24H. 4.  Septra 320mg  IV Q8H (15mg /kg/day of TMP). 5.  Monitor renal function, clinical course, vanc trough at steady-state.  Laney Potash, Kiannah Grunow Dien 10/27/2011,9:52 AM

## 2011-10-27 NOTE — Progress Notes (Signed)
UR Completed.  Gerald Cox [name] 10/27/2011  

## 2011-10-27 NOTE — Procedures (Signed)
Intubation Procedure Note Gerald Cox [id number] [birth year]  Procedure: Intubation Indications: Respiratory insufficiency  Procedure Details Consent: Unable to obtain consent because of altered level of consciousness. Time Out: Verified patient identification, verified procedure, site/side was marked, verified correct patient position, special equipment/implants available, medications/allergies/relevent history reviewed, required imaging and test results available.  Performed  Maximum sterile technique was used including gloves.  MAC    Evaluation Hemodynamic Status: Persistent hypotension treated with pressors; O2 sats: stable throughout Patient's Current Condition: unstable Complications: No apparent complications Patient did tolerate procedure well. Chest X-ray ordered to verify placement.  CXR: pending.   Nelda Bucks 10/27/2011

## 2011-10-28 ENCOUNTER — Encounter (HOSPITAL_COMMUNITY): Payer: Self-pay | Admitting: *Deleted

## 2011-10-28 ENCOUNTER — Inpatient Hospital Stay (HOSPITAL_COMMUNITY): Payer: Medicaid Other

## 2011-10-28 DIAGNOSIS — J984 Other disorders of lung: Secondary | ICD-10-CM

## 2011-10-28 DIAGNOSIS — J9600 Acute respiratory failure, unspecified whether with hypoxia or hypercapnia: Secondary | ICD-10-CM

## 2011-10-28 DIAGNOSIS — A419 Sepsis, unspecified organism: Secondary | ICD-10-CM

## 2011-10-28 DIAGNOSIS — B59 Pneumocystosis: Secondary | ICD-10-CM

## 2011-10-28 DIAGNOSIS — R6521 Severe sepsis with septic shock: Secondary | ICD-10-CM

## 2011-10-28 LAB — HEPATITIS B CORE ANTIBODY, TOTAL: Hep B Core Total Ab: NEGATIVE

## 2011-10-28 LAB — GLUCOSE, CAPILLARY
Glucose-Capillary: 250 mg/dL — ABNORMAL HIGH (ref 70–99)
Glucose-Capillary: 132 mg/dL — ABNORMAL HIGH (ref 70–99)
Glucose-Capillary: 95 mg/dL (ref 70–99)
Glucose-Capillary: 177 mg/dL — ABNORMAL HIGH (ref 70–99)

## 2011-10-28 LAB — CD4/CD8 (T-HELPER/T-SUPPRESSOR CELL)
CD4%: 1 % — ABNORMAL LOW (ref 30.0–60.0)
CD8 T Cell Abs: 260 /uL (ref 230–1000)
CD8tox: 62 % — ABNORMAL HIGH (ref 15.0–40.0)
Ratio: 0.01 — ABNORMAL LOW (ref 1.0–3.0)

## 2011-10-28 LAB — URINE CULTURE
Colony Count: NO GROWTH
Culture: NO GROWTH

## 2011-10-28 LAB — POCT I-STAT 3, ART BLOOD GAS (G3+)
Acid-base deficit: 7 mmol/L — ABNORMAL HIGH (ref 0.0–2.0)
Acid-base deficit: 4 mmol/L — ABNORMAL HIGH (ref 0.0–2.0)
Bicarbonate: 21 mEq/L (ref 20.0–24.0)
Patient temperature: 98.6
Patient temperature: 35
TCO2: 22 mmol/L (ref 0–100)
pO2, Arterial: 83 mmHg (ref 80.0–100.0)

## 2011-10-28 LAB — COMPREHENSIVE METABOLIC PANEL
ALT: 13 U/L (ref 0–53)
AST: 40 U/L — ABNORMAL HIGH (ref 0–37)
Albumin: 1 g/dL — ABNORMAL LOW (ref 3.5–5.2)
CO2: 22 mEq/L (ref 19–32)
Calcium: 6.9 mg/dL — ABNORMAL LOW (ref 8.4–10.5)
Chloride: 107 mEq/L (ref 96–112)
GFR calc non Af Amer: >90 mL/min (ref >90)
Sodium: 135 mEq/L (ref 135–145)

## 2011-10-28 LAB — PRO B NATRIURETIC PEPTIDE: Pro B Natriuretic peptide (BNP): 619.7 pg/mL — ABNORMAL HIGH (ref 0–125)

## 2011-10-28 LAB — CBC
HCT: 32.8 % — ABNORMAL LOW (ref 39.0–52.0)
MCH: 30.1 pg (ref 26.0–34.0)
MCV: 89.6 fL (ref 78.0–100.0)
Platelets: 278 10*3/uL (ref 150–400)
RBC: 3.66 MIL/uL — ABNORMAL LOW (ref 4.22–5.81)

## 2011-10-28 LAB — HEPATITIS C ANTIBODY: HCV Ab: NEGATIVE

## 2011-10-28 LAB — PROCALCITONIN: Procalcitonin: 3.39 ng/mL

## 2011-10-28 LAB — RPR: RPR Ser Ql: NONREACTIVE

## 2011-10-28 MED ORDER — DOBUTAMINE-DEXTROSE 2-5 MG/ML-% IV SOLN: 2.5000 ug/kg/min | INTRAVENOUS | Status: DC

## 2011-10-28 MED ORDER — SODIUM CHLORIDE 0.9 % IV SOLN: Freq: Once | INTRAVENOUS | Status: DC

## 2011-10-28 MED ORDER — PRO-STAT SUGAR FREE PO LIQD
30.0000 mL | Freq: Every day | ORAL | Status: DC
  Administered 2011-10-28 – 2011-10-29 (×2): 30 mL
  Filled 2011-10-28 (×2): qty 30

## 2011-10-28 MED ORDER — PNEUMOCOCCAL VAC POLYVALENT 25 MCG/0.5ML IJ INJ
0.5000 mL | INJECTION | INTRAMUSCULAR | Status: AC
  Administered 2011-10-29: 0.5 mL via INTRAMUSCULAR
  Filled 2011-10-28 (×2): qty 0.5

## 2011-10-28 MED ORDER — INFLUENZA VIRUS VACC SPLIT PF IM SUSP
0.5000 mL | Freq: Once | INTRAMUSCULAR | Status: AC
  Administered 2011-10-29: 0.5 mL via INTRAMUSCULAR
  Filled 2011-10-28: qty 0.5

## 2011-10-28 MED ORDER — ARTIFICIAL TEARS OP OINT
TOPICAL_OINTMENT | OPHTHALMIC | Status: DC | PRN
  Administered 2011-10-28 (×2): via OPHTHALMIC
  Filled 2011-10-28: qty 3.5

## 2011-10-28 MED ORDER — FLEXIFLO TOPTAINER FEED SET MISC
Status: AC
  Filled 2011-10-28: qty 1

## 2011-10-28 MED ORDER — INFLUENZA VIRUS VACC SPLIT PF IM SUSP
0.5000 mL | Freq: Once | INTRAMUSCULAR | Status: DC
  Filled 2011-10-28 (×2): qty 0.5

## 2011-10-28 MED ORDER — SODIUM CHLORIDE 0.9 % IV SOLN
50.0000 ug/h | INTRAVENOUS | Status: DC
  Administered 2011-10-28 – 2011-10-29 (×2): 50 ug/h via INTRAVENOUS
  Filled 2011-10-28: qty 50

## 2011-10-28 MED ORDER — SODIUM CHLORIDE 0.9 % IV SOLN
2.0000 mg/h | INTRAVENOUS | Status: DC
  Administered 2011-10-28: 3 mg/h via INTRAVENOUS
  Administered 2011-10-28: 2 mg/h via INTRAVENOUS
  Filled 2011-10-28: qty 10

## 2011-10-28 MED ORDER — MIDAZOLAM BOLUS VIA INFUSION
1.0000 mg | INTRAVENOUS | Status: DC | PRN
  Administered 2011-10-28: 2 mg

## 2011-10-28 MED ORDER — GLUCERNA 1.2 CAL PO LIQD
1000.0000 mL | ORAL | Status: DC
  Administered 2011-10-28: 1000 mL
  Filled 2011-10-28 (×2): qty 1000

## 2011-10-28 MED ORDER — FENTANYL BOLUS VIA INFUSION: 50.0000 ug | Freq: Four times a day (QID) | INTRAVENOUS | Status: DC | PRN

## 2011-10-28 NOTE — Progress Notes (Signed)
Inpatient Diabetes Program Recommendations  AACE/ADA: New Consensus Statement on Inpatient Glycemic Control (2009)  Target Ranges:  Prepandial:   less than 140 mg/dL      Peak postprandial:   less than 180 mg/dL (1-2 hours)      Critically ill patients:  140 - 180 mg/dL   Reason for Visit: HyperglycemiaMD, please see recommendations by DTP coordinator.    Inpatient Diabetes Program Recommendations Correction (SSI): Please consider increase in correction to moderate q 4hrs or use the Hyperglycemia Protocol  Note: MD, please see recommendations by DTP coordinator.

## 2011-10-28 NOTE — Progress Notes (Signed)
CRITICAL VALUE ALERT  Critical value received:  +BAL for Pneumocystis  Date of notification: 10/27/11  Time of notification:  2140   Critical value read back:yes  Nurse who received alert:  Thalia Party   MD notified (1st page):  Ramaswamy  Time of first page:  2145 MD notified (2nd page):  Time of second page:  Responding MD:  Marchelle Gearing  Time MD responded: 2141  New orders received see MAR. Will continue current atbx treatment.

## 2011-10-28 NOTE — Progress Notes (Signed)
Inpatient Diabetes Program Recommendations  AACE/ADA: New Consensus Statement on Inpatient Glycemic Control (2009)  Target Ranges:  Prepandial:   less than 140 mg/dL      Peak postprandial:   less than 180 mg/dL (1-2 hours)      Critically ill patients:  140 - 180 mg/dL   Reason for Visit: Addendum  Inpatient Diabetes Program Recommendations Correction (SSI): Please consider increase in correction to moderate q 4hrs or use the Hyperglycemia Protocol HgbA1C: Please check as well while here

## 2011-10-28 NOTE — Progress Notes (Signed)
INITIAL ADULT NUTRITION ASSESSMENT Date: 10/28/2011   Time: 12:50 PM Reason for Assessment: VDRF, TF Consult  ASSESSMENT: Male 40 y.o.  Dx: hypoxic respiratory failure, refractory shock, and bilateral infiltrates on CXR   Hx: Smoker, HIV postitive  Related Meds:     . sodium chloride   Intravenous Once  . antiseptic oral rinse  15 mL Mouth Rinse QID  . chlorhexidine  15 mL Mouth Rinse BID  . dextrose      . folic acid  1 mg Intravenous Daily  . heparin  5,000 Units Subcutaneous Q8H  . influenza  inactive virus vaccine  0.5 mL Intramuscular Once  . insulin aspart  0-9 Units Subcutaneous Q4H  . lidocaine (cardiac) 100 mg/82ml      . methylPREDNISolone (SOLU-MEDROL) injection  40 mg Intravenous Q12H  . midazolam (VERSED) infusion  1 mg/hr Intravenous Once  . multivitamin (MVI) IV infusion (LR or D5LR)  10 mL Intravenous Once  . pantoprazole (PROTONIX) IV  40 mg Intravenous QHS  . piperacillin-tazobactam  3.375 g Intravenous Q8H  . pneumococcal 23 valent vaccine  0.5 mL Intramuscular Tomorrow-1000  . rocuronium      . sodium chloride      . sodium chloride      . sulfamethoxazole-trimethoprim  320 mg Intravenous Q8H  . thiamine  100 mg Intravenous Daily  . vancomycin  1,000 mg Intravenous Q8H  . DISCONTD: fentaNYL infusion INTRAVENOUS  10 mcg/hr Intravenous Once  . DISCONTD: hydrocortisone sodium succinate  50 mg Intravenous Q6H  . DISCONTD: hydrocortisone sodium succinate  50 mg Intravenous Once  . DISCONTD: levofloxacin (LEVAQUIN) IV  750 mg Intravenous Q24H  . DISCONTD: methylPREDNISolone (SOLU-MEDROL) injection  40 mg Intravenous Q12H    Ht:  Estimated 68" (172.7 cm)  Wt: 140 lb 14 oz (63.9 kg)  Ideal Wt:    70 kg  % Ideal Wt: 91.2%  Usual Wt: N/A  % Usual Wt: N/A  Using estimated height, BMI = 21.4  Food/Nutrition Related Hx: unknown  Labs: Sodium 135 mEq/L      Potassium 4.0 mEq/L      Chloride 107 mEq/L      CO2 22 mEq/L      Glucose, Bld 301 mg/dL H     BUN  6 mg/dL      Creatinine, Ser 1.61 mg/dL L     Calcium 6.9 mg/dL L     Total Protein 5.9 g/dL L     Albumin 1.0 g/dL L     AST 40 U/L H     ALT 13 U/L      Alkaline Phosphatase 49 U/L      Total Bilirubin 0.1 mg/dL L  CBG's:  096-045  I/O last 3 completed shifts: In: 12841.7 [I.V.:10591; IV Piggyback:2250.7] Out: 5870 [Urine:5870] Total I/O In: 782 [I.V.:285; IV Piggyback:497] Out: 410 [Urine:410]    Diet Order:  NPO  Supplements/Tube Feeding:  IVF:    sodium chloride Last Rate: 25 mL/hr at 10/28/11 1200  DOBUTamine NICU IV Infusion 4000 mcg/mL =/>1.5 kg (Blue)   fentaNYL infusion INTRAVENOUS Last Rate: 50 mcg/hr (10/28/11 1200)  midazolam (VERSED) infusion Last Rate: 2 mg/hr (10/28/11 1200)  norepinephrine (LEVOPHED) Adult infusion Last Rate: 4 mcg/min (10/28/11 1200)  norepinephrine (LEVOPHED) Adult infusion Last Rate: Stopped (10/27/11 1931)  DISCONTD: norepinephrine (LEVOPHED) Adult infusion   DISCONTD: vecuronium (NORCURON) infusion Last Rate: Stopped (10/28/11 1155)    Estimated Nutritional Needs:   Kcal: 1515 Protein: 77-89 grams Fluid: 2 liters  NUTRITION  DIAGNOSIS: -Inadequate oral intake (NI-2.1).  Status: Ongoing  RELATED TO: inability to eat  AS EVIDENCE BY: NPO status  MONITORING/EVALUATION(Goals): Goal:  TF to meet nutrition needs. Monitor:  TF tolerance and adequacy, weight trend.  EDUCATION NEEDS: -No education needs identified at this time  INTERVENTION: Initiate TF via enteral feeding tube with Glucerna 1.2 @ 20 ml/h, increase by 10 ml every 4 h to 50 ml/h with Prostat 30 ml once daily to provide 1512 kcals, 87 grams of protein, 966 ml free water daily.  Dietitian [phone]  DOCUMENTATION CODES Per approved criteria  -Not Applicable    Hettie Holstein 10/28/2011, 12:50 PM

## 2011-10-28 NOTE — Progress Notes (Signed)
Infectious Diseases Progress Note:  ID: 40yo Gerald Cox newly diagnosed HIV+ (CD4 CT/VL pending) presents with acute respiratory failure s/p intubation and pressors  Subjective: Respiratory status mildly improved since vent settings decreased from 50 to 40% FIO2. Now only on levophed 5, and dobutamine has been discontinued. Hypothermic thus has bear hugger.  ABtx: vancomycin D2, levofloxacin D2, piptazo D2, bactrim 15mg /kg D2 Medications: I have reviewed the patient's current medications.  Objective: Vital signs in last 24 hours: Temp:  [92.7 F (33.7 C)-95.5 F (35.3 C)] 94.6 F (34.8 C) (11/06 1200) Pulse Rate:  [64-100] 84  (11/06 1200) Resp:  [17-35] 35  (11/06 1200) BP: (93-125)/(66-87) 111/78 mmHg (11/06 1145) SpO2:  [86 %-100 %] 97 % (11/06 1200) Arterial Line BP: (74-150)/(48-87) 91/76 mmHg (11/06 1200) FiO2 (%):  [35 %-50.2 %] 40 % (11/06 1200) Weight:  [63.9 kg (140 lb 14 oz)] 140 lb 14 oz (63.9 kg) (11/06 0500) Weight change:  Last BM Date:  (unknown)  Gen: diaphoretic HEENT: pupils PERRLA, no scleral icterus, OT in place, Neck = supple, left IJ Pulm= bilateral rhonchi Heart= tachy, nl s1, s2. No g/m/r Abd= decreased bowel sounds, non-distended, no HSM Ext = warm, no c/c/e Skin = no rash Neuro = deferred since patient still on paralytic  Lab Results: CBC    Component Value Date/Time   WBC 4.1 10/28/2011 0500   RBC 3.66* 10/28/2011 0500   HGB 11.0* 10/28/2011 0500   HCT 32.8* 10/28/2011 0500   PLT 278 10/28/2011 0500   MCV 89.6 10/28/2011 0500   MCH 30.1 10/28/2011 0500   MCHC 33.5 10/28/2011 0500   RDW 13.6 10/28/2011 0500   LYMPHSABS 0.3* 10/27/2011 1050   MONOABS 0.1 10/27/2011 1050   EOSABS 0.0 10/27/2011 1050   BASOSABS 0.0 10/27/2011 1050   TLC: 300 CMP     Component Value Date/Time   NA 135 10/28/2011 0500   K 4.0 10/28/2011 0500   CL 107 10/28/2011 0500   CO2 22 10/28/2011 0500   GLUCOSE 301* 10/28/2011 0500   BUN 6 10/28/2011 0500   CREATININE 0.46*  10/28/2011 0500   CALCIUM 6.9* 10/28/2011 0500   PROT 5.9* 10/28/2011 0500   ALBUMIN 1.0* 10/28/2011 0500   AST 40* 10/28/2011 0500   ALT 13 10/28/2011 0500   ALKPHOS 49 10/28/2011 0500   BILITOT 0.1* 10/28/2011 0500   GFRNONAA >90 10/28/2011 0500   GFRAA >90 10/28/2011 0500   hiv reactive No results found for this basename: [id number]   MICRO: Blood Culture HCV negative HBsAg negative HBsAb negative HBcAb negative PJP positive AFB on bronchial wash is negative Blood culture pending BAL culture pending  Studies/Results: CXR 11/06:  The ET tube tip is above the carina.  There is a left IJ catheter with tip in the SVC.  The heart size appears normal.  Diffuse bilateral airspace opacities are again noted and appear unchanged from previous exam.  IMPRESSION:  1.  No change in bilateral airspace opacities compared with previous exam.  Assessment/Plan: 1)HIV = await CD4 count and viral load to decide on initiation of therapy. suscept CD4 count will be low since total lymphocyte count is 300 2) acute respiratory distress 2/2 PCP = continue with current dose of TMP/SMX at 15mg /kg and steroids 3) isolation = please send suction for AFB, if negative x 3, can remove from isolation. Thus far, BAL is negative for AFB 4) diarrhea = please follow up on stool cultures for giardia, cryptosporidium,  5) malnutrition = would recommend  to have dobhoff to start tube feeds 6) empiric antibiotics for secondary bacterial infection = continue with vancomycin and pip/tazo, please discontinue levofloxacin. If bacterial cultures are negative in 48hrs, would then discontinue vanco and pip/tazo and keep bactrim on for treatment of PCP pneumonia. 7) OI prophylaxis = will decide if need to give azithromycin for MAC prophylaxis based upon CD4 count. 8) would consider getting coccidiodomycosis antibody testing.   LOS: 1 day   Gerald Cox 10/28/2011, 1:51 PM

## 2011-10-28 NOTE — Progress Notes (Signed)
Follow up - Critical Care Medicine Note  Patient Details:    Gerald Cox is an 40 y.o. male. 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  Is now known to be HIV+ with PCP identified on BAL.  Lines/tubes LIJ 11/5 >> R-radial art line 11/5>> Foley 11/5 >>  Microbiology/Sepsis markers: PCT 5 (11/5) >> 3.39 (11/6) Lactate 0.8 (11/5) >> 1.7 (11/6) HIV positive CD4 >> Urine Cx (11/5): Negative MRSA: Negative AFB Blooc Cx (11/5) >> Stool O+P (11/5)>> AFB Stain (11/5) >>  Anti-infectives:  Levaquin (11/5) >> Zosyn (11/5) >>  Vancomycin (11/5) >> Bactrim (11/5) >>  Best Practice/Protocols:  Protonix SQ Heparin  Consults: None  Studies/events: Bronchoscopy (11/5) >> No lesions, no secretions, BAL performed  Subjective:    Overnight Issues: No acute events, pressors have slowly been decreased.  Objective:  Vital signs  Temp:  [92.7 F (33.7 C)-95.5 F (35.3 C)] 92.8 F (33.8 C) (11/06 0600) Pulse Rate:  [64-100] 67  (11/06 0800) Resp:  [16-35] 18  (11/06 0800) BP: (93-152)/(67-108) 102/73 mmHg (11/06 0800) SpO2:  [86 %-100 %] 99 % (11/06 0800) Arterial Line BP: (74-151)/(48-93) 91/70 mmHg (11/06 0800) FiO2 (%):  [0.7 %-70 %] 40.1 % (11/06 0800) Weight:  [136 lb (61.689 kg)-140 lb 14 oz (63.9 kg)] 140 lb 14 oz (63.9 kg) (11/06 0500)  Hemodynamic parameters for last 24 hours: CVP:  [6 mmHg-12 mmHg] 8 mmHg   Intake/Output Summary (Last 24 hours) at 10/28/11 0914 Last data filed at 10/28/11 0900  Gross per 24 hour  Intake 6014.7 ml  Output   5930 ml  Net   84.7 ml    Physical Exam:  Gen: Sedated, paralized HEENT: Mucous membranes moist, LIJ in place CV: RRR Resp: Coarse throughout Abd: SNTND Ext: Under bear hugger is warm with mildly delayed cap refill.  One hand not under bear hugger is cool. Neuro:  Sedated,  paralized  Ventilator  Settings: Vent Mode:  [-] PRVC FiO2 (%):  [0.7 %-70 %] 40.1 % Set Rate:  [35 bmp] 35 bmp Vt Set:  [410 mL] 410 mL PEEP:  [8 cmH20-10 cmH20] 8 cmH20 Plateau Pressure:  [19 cmH20-26 cmH20] 21 cmH20  LAB RESULTS BMET    Component Value Date/Time   NA 135 10/28/2011 0500   K 4.0 10/28/2011 0500   CL 107 10/28/2011 0500   CO2 22 10/28/2011 0500   GLUCOSE 301* 10/28/2011 0500   BUN 6 10/28/2011 0500   CREATININE 0.46* 10/28/2011 0500   CALCIUM 6.9* 10/28/2011 0500   GFRNONAA >90 10/28/2011 0500   GFRAA >90 10/28/2011 0500   CBC    Component Value Date/Time   WBC 4.1 10/28/2011 0500   RBC 3.66* 10/28/2011 0500   HGB 11.0* 10/28/2011 0500   HCT 32.8* 10/28/2011 0500   PLT 278 10/28/2011 0500   MCV 89.6 10/28/2011 0500   MCH 30.1 10/28/2011 0500   MCHC 33.5 10/28/2011 0500   RDW 13.6 10/28/2011 0500   LYMPHSABS 0.3* 10/27/2011 1050   MONOABS 0.1 10/27/2011 1050   EOSABS 0.0 10/27/2011 1050   BASOSABS 0.0 10/27/2011 1050   ABG    Component Value Date/Time   PHART 7.255* 10/28/2011 0417   HCO3 20.7 10/28/2011 0417   TCO2 22 10/28/2011 0417   ACIDBASEDEF 7.0* 10/28/2011 0417   O2SAT 96.0 10/28/2011 0417    Additional lab data  Radiology  Assessment/Plan:  NEURO  Sedation, paraylisis   Plan: Will discontinue paralysis and perform routine wakeup trials per protocol.  PULM  ARDS, Pneumonia, Acute respiratory failure   Plan: Currently paralyzed with tidal volume  7cc/kg. - DC vecuronium - - may try to wean as tolerated   CARDIO  Septic Shock   Plan: - Continue pressors to maintain MAP >65 - Fluid bolus and increase rate to maintain CVP > 8 - Wean pressors as tolerated  RENAL  Hyponatremia   Plan: Will increase rate of NS to 150cc/hr as detailed in cardiac - Monitor AM BMET for evidence of elevated Cr  GI  Protein Calorie Malnutrition   Plan: Insert PANDA, begin feeds per nutrition  ID  HIV, PCP, possible bacterial PNA, resp isolation   Plan: Cultures as  above - HIV treatment per ID, CD4 pending - On Bactrim for PCP - Vanc/Zosyn for ?other bacterial infection per ID - On respiratory isolation until broncheal AFB smear returns later this afternoon  HEME  No active issues   Plan: na  ENDO Blood glucose control   Plan: control per protocol and nutrition.  Global Issues  Family is not currently aware of diagnosis of HIV.   LOS: 1 day    Gerald Cox 10/28/2011  *Care during the described time interval was provided by me and/or other providers on the critical care team.  I have reviewed this patient's available data, including medical history, events of note, physical examination and test results as part of my evaluation.   Care co-ordinated with ID consultant, updated family in detail  CC time x 40 mins Leslyn Monda V.

## 2011-10-28 NOTE — Progress Notes (Signed)
eLink Physician-Brief Progress Note Patient Name: Marke Goodwyn DOB: Nov 24, 1971 MRN: [id number]  Date of Service  10/28/2011   HPI/Events of Note  Order Clarification for Cont Sedation   eICU Interventions  Order for fentanyl/versed was written as a one time order - changed to cont sedation protocol      Carlyann Placide 10/28/2011, 1:25 AM

## 2011-10-29 LAB — DRUGS OF ABUSE SCREEN W/O ALC, ROUTINE URINE
Amphetamine Screen, Ur: NEGATIVE
Barbiturate Quant, Ur: NEGATIVE
Cocaine Metabolites: NEGATIVE
Methadone: NEGATIVE
Phencyclidine (PCP): NEGATIVE

## 2011-10-29 LAB — BLOOD GAS, ARTERIAL
Acid-base deficit: 1.8 mmol/L (ref 0.0–2.0)
Drawn by: 28701
FIO2: 0.4 %
MECHVT: 410 mL
O2 Saturation: 96.8 %
PEEP: 5 cmH2O
RATE: 35 resp/min

## 2011-10-29 LAB — CBC
HCT: 30.2 % — ABNORMAL LOW (ref 39.0–52.0)
Hemoglobin: 10.4 g/dL — ABNORMAL LOW (ref 13.0–17.0)
MCH: 30.3 pg (ref 26.0–34.0)
RBC: 3.43 MIL/uL — ABNORMAL LOW (ref 4.22–5.81)

## 2011-10-29 LAB — BASIC METABOLIC PANEL
Chloride: 103 mEq/L (ref 96–112)
GFR calc non Af Amer: >90 mL/min (ref >90)
Glucose, Bld: 156 mg/dL — ABNORMAL HIGH (ref 70–99)
Potassium: 4 mEq/L (ref 3.5–5.1)
Sodium: 133 mEq/L — ABNORMAL LOW (ref 135–145)

## 2011-10-29 LAB — CULTURE, RESPIRATORY W GRAM STAIN

## 2011-10-29 LAB — GLUCOSE, CAPILLARY: Glucose-Capillary: 146 mg/dL — ABNORMAL HIGH (ref 70–99)

## 2011-10-29 MED ORDER — PANTOPRAZOLE SODIUM 40 MG IV SOLR: 40.0000 mg | Freq: Every day | INTRAVENOUS | Status: DC

## 2011-10-29 MED ORDER — SODIUM CHLORIDE 0.9 % IJ SOLN
INTRAMUSCULAR | Status: AC
  Administered 2011-10-29: 20:00:00
  Filled 2011-10-29: qty 20

## 2011-10-29 MED ORDER — ONDANSETRON HCL 4 MG/2ML IJ SOLN
4.0000 mg | Freq: Once | INTRAMUSCULAR | Status: AC
  Administered 2011-10-29: 4 mg via INTRAVENOUS

## 2011-10-29 MED ORDER — PANTOPRAZOLE SODIUM 40 MG PO TBEC
40.0000 mg | DELAYED_RELEASE_TABLET | Freq: Every day | ORAL | Status: DC
  Administered 2011-10-29 – 2011-11-07 (×10): 40 mg via ORAL
  Filled 2011-10-29 (×10): qty 1

## 2011-10-29 MED ORDER — ONDANSETRON HCL 4 MG/2ML IJ SOLN
INTRAMUSCULAR | Status: AC
  Administered 2011-10-29: 4 mg via INTRAVENOUS
  Filled 2011-10-29: qty 2

## 2011-10-29 MED ORDER — SODIUM CHLORIDE 0.9 % IJ SOLN
INTRAMUSCULAR | Status: AC
  Filled 2011-10-29: qty 20

## 2011-10-29 MED ORDER — PANTOPRAZOLE SODIUM 40 MG PO PACK
40.0000 mg | PACK | Freq: Every day | ORAL | Status: DC
  Filled 2011-10-29: qty 20

## 2011-10-29 MED ORDER — INSULIN ASPART 100 UNIT/ML ~~LOC~~ SOLN
0.0000 [IU] | Freq: Three times a day (TID) | SUBCUTANEOUS | Status: DC
  Administered 2011-10-31: 3 [IU] via SUBCUTANEOUS
  Administered 2011-10-31: 5 [IU] via SUBCUTANEOUS
  Administered 2011-10-31 – 2011-11-01 (×3): 2 [IU] via SUBCUTANEOUS
  Administered 2011-11-01 – 2011-11-02 (×2): 3 [IU] via SUBCUTANEOUS
  Administered 2011-11-03 (×2): 2 [IU] via SUBCUTANEOUS
  Administered 2011-11-03: 15 [IU] via SUBCUTANEOUS
  Administered 2011-11-04 – 2011-11-05 (×2): 3 [IU] via SUBCUTANEOUS
  Administered 2011-11-05: 5 [IU] via SUBCUTANEOUS
  Administered 2011-11-05: 2 [IU] via SUBCUTANEOUS
  Administered 2011-11-06: 3 [IU] via SUBCUTANEOUS
  Administered 2011-11-06: 2 [IU] via SUBCUTANEOUS
  Filled 2011-10-29: qty 3

## 2011-10-29 NOTE — Progress Notes (Signed)
Aromatherapy employed for pt's C/O nausea. 4 mg Zofran given IV previously, per MD order. After education done, verbal consent obtained from pt for aromatherapy. One drop peppermint essential oil applied to 2X2 gauze, pinned to pt's gown. I instructed him to breathe deeply. Follow-up revealed decreased nausea, per pt. Will continue to monitor.

## 2011-10-29 NOTE — Progress Notes (Signed)
Follow up - Critical Care Medicine Note  Patient Details:    Gerald Cox is an 40 y.o. male. 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  Is now known to be HIV+ with PCP identified on BAL.  Due to fast improvement, are leaning away from shock due to infectious cause.  Lines/tubes LIJ 11/5 >> R-radial art line 11/5>> 11/7 Foley 11/5 >>  Microbiology/Sepsis markers: PCT 5 (11/5) >> 3.39 (11/6) Lactate 0.8 (11/5) >> 1.7 (11/6) HIV >> positive CD4 >> <10 Urine Cx (11/5) >> Negative MRSA >> Negative AFB Blooc Cx (11/5) >> Stool O+P (11/5)>> AFB Stain (11/5) >> Negative RPR >> Negative Hep B >> Negative Hep C >> Negative  Anti-infectives:  Levaquin (11/5) >>11/6 Zosyn (11/5) >>  Vancomycin (11/5) >> Bactrim (11/5) >>  Best Practice/Protocols:  Protonix SQ Heparin  Consults: ID  Studies/events: Bronchoscopy (11/5) >> No lesions, no secretions, BAL performed 11/6: Off pressors with MAP > 65  Subjective:    Overnight Issues: Enteral feeds begun  Objective:  Vital signs  Temp:  [94.5 F (34.7 C)-97.1 F (36.2 C)] 94.5 F (34.7 C) (11/07 0431) Pulse Rate:  [57-104] 83  (11/07 0600) Resp:  [16-38] 18  (11/07 0600) BP: (79-164)/(50-95) 91/61 mmHg (11/07 0600) SpO2:  [95 %-100 %] 97 % (11/07 0600) Arterial Line BP: (67-131)/(47-83) 95/56 mmHg (11/07 0600) FiO2 (%):  [35 %-40.4 %] 40 % (11/07 0600) Weight:  [143 lb 15.4 oz (65.3 kg)] 143 lb 15.4 oz (65.3 kg) (11/07 0451)  Hemodynamic parameters for last 24 hours: CVP:  [6 mmHg-8 mmHg] 6 mmHg   Intake/Output Summary (Last 24 hours) at 10/29/11 0706 Last data filed at 10/29/11 0600  Gross per 24 hour  Intake 3869.2 ml  Output   4260 ml  Net -390.8 ml    Physical Exam:  Gen: Awake and alert, responds HEENT: Mucous membranes moist, LIJ in place, tube in place CV:  RRR Resp: Coarse throughout, somewhat improved from yesterday Abd: SNTND Ext: Warm and well perfused Neuro:  Alert, responds to commands, eyes open, follows commands, good cough  Ventilator  Settings: Vent Mode:  [-] PRVC FiO2 (%):  [35 %-40.4 %] 40 % Set Rate:  [35 bmp] 35 bmp Vt Set:  [410 mL] 410 mL PEEP:  [4.3 cmH20-5.9 cmH20] 5.9 cmH20 Plateau Pressure:  [14 cmH20-15 cmH20] 14 cmH20  LAB RESULTS BMET    Component Value Date/Time   NA 133* 10/29/2011 0330   K 4.0 10/29/2011 0330   CL 103 10/29/2011 0330   CO2 22 10/29/2011 0330   GLUCOSE 156* 10/29/2011 0330   BUN 10 10/29/2011 0330   CREATININE 0.50 10/29/2011 0330   CALCIUM 7.6* 10/29/2011 0330   GFRNONAA >90 10/29/2011 0330   GFRAA >90 10/29/2011 0330   CBC    Component Value Date/Time   WBC 10.4 10/29/2011 0330   RBC 3.43* 10/29/2011 0330   HGB 10.4* 10/29/2011 0330   HCT 30.2* 10/29/2011 0330   PLT 296 10/29/2011 0330   MCV 88.0 10/29/2011 0330   MCH 30.3 10/29/2011 0330   MCHC 34.4 10/29/2011 0330   RDW 13.6 10/29/2011 0330   LYMPHSABS 0.3* 10/27/2011 1050   MONOABS 0.1 10/27/2011 1050   EOSABS 0.0 10/27/2011 1050   BASOSABS 0.0 10/27/2011 1050   ABG    Component Value Date/Time   PHART 7.435 10/29/2011 0335   HCO3 21.8 10/29/2011  0335   TCO2 22.8 10/29/2011 0335   ACIDBASEDEF 1.8 10/29/2011 0335   O2SAT 96.8 10/29/2011 0335    Additional lab data CD4<10 HepB/C Negative RPR Negative  Radiology  Assessment/Plan:   NEURO  Sedation   Plan: dc sedation along with extubation  PULM  ARDS, Pneumonia, Acute respiratory failure   Plan: Good tidal volumes on 5/5 CPAP - Extubate to [name] today - Cont Abx for PNA - Titrate O2 as needed  CARDIO  Septic Shock (resolving)   Plan: - d/c pressors - follow blood pressure  RENAL  Hyponatremia   Plan:  Will back off on fluids and encourage PO  GI  Protein Calorie Malnutrition   Plan:now that he is extubated, d/c tube feeds and place on clears with ADAT  ID  HIV, PCP PNA    Plan: Cultures as above - HIV treatment per ID, CD4 <10 - VIral Load pending - On Bactrim for PCP - Vanc/Zosyn d/c per ID - isolation d/c due to negative AFB from BAL  HEME  No active issues   Plan: na  ENDO Blood glucose control   Plan: mildly elevated, will cont to monitor as he transitions to PO.  Global Issues  Patient informed of diagnosis.  Family is not currently aware of diagnosis of HIV.  Pt does not wish them to be told by [place].   LOS: 2 days    Cox,Gerald 10/29/2011  I examined the pt before & after extubation & formulated the above care plan  *Care during the described time interval was provided by me and/or other providers on the critical care team.  I have reviewed this patient's available data, including medical history, events of note, physical examination and test results as part of my evaluation.   Care co-ordinated with ID consultant, updated family in detail  CC time x 40 mins  Gerald Cox V.

## 2011-10-29 NOTE — Progress Notes (Signed)
Infectious Diseases Progress Note:  ID: 40yo Gerald Cox newly diagnosed HIV+ (CD4 < 10 /VL pending) presents with acute respiratory failure 2/2 PCP pneumonia s/p intubation and pressors   24hr events/subjective: patient weaned off pressors, and successfully extubated on 4LNC. Still remains slightly confused, repetitious in responding to answers per primary team. The patient stated that he knew that he was HIV+. No new cultures results. He was removed from respiratory precautions since BAL showed no AFB. The patient states he has some sore throat due to intubation, some wheezing, abdominal cramping, no diarrhea that he knows of in the last 12 hrs.  ABtx: vancomycin D3,  piptazo D3, bactrim 15mg /kg D3 Medications: I have reviewed the patient's current medications.  Objective: Vital signs in last 24 hours: Temp:  [94.5 F (34.7 C)-97.1 F (36.2 C)] 97.1 F (36.2 C) (11/07 1200) Pulse Rate:  [57-111] 91  (11/07 1230) Resp:  [16-38] 28  (11/07 1230) BP: (73-164)/(50-95) 73/58 mmHg (11/07 1230) SpO2:  [89 %-100 %] 89 % (11/07 1230) Arterial Line BP: (67-131)/(47-83) 121/69 mmHg (11/07 1100) FiO2 (%):  [29.9 %-40.4 %] 30 % (11/07 1030) Weight:  [65.3 kg (143 lb 15.4 oz)] 143 lb 15.4 oz (65.3 kg) (11/07 0451) Weight change: 1.4 kg (3 lb 1.4 oz) Last BM Date:  (prior to admission)  Gen: awake sitting in bed, answers questions appropriately > 50% of time HEENT: pupils PERRLA, no scleral icterus, ng in place for tube feeds, mild thrush on tonge Neck = supple, left IJ Pulm= bilateral rhonchi but improved from yesterday Heart= tachy, nl s1, s2. No g/m/r Abd= decreased bowel sounds, non-distended, no HSM Ext = warm, no c/c/e Skin = no rash Neuro = Ax O by 2, CN2-12 intact, moves all limbs  Lab Results: CBC    Component Value Date/Time   WBC 10.4 10/29/2011 0330   RBC 3.43* 10/29/2011 0330   HGB 10.4* 10/29/2011 0330   HCT 30.2* 10/29/2011 0330   PLT 296 10/29/2011 0330   MCV 88.0  10/29/2011 0330   MCH 30.3 10/29/2011 0330   MCHC 34.4 10/29/2011 0330   RDW 13.6 10/29/2011 0330   LYMPHSABS 0.3* 10/27/2011 1050   MONOABS 0.1 10/27/2011 1050   EOSABS 0.0 10/27/2011 1050   BASOSABS 0.0 10/27/2011 1050   TLC: 300 CMP     Component Value Date/Time   NA 133* 10/29/2011 0330   K 4.0 10/29/2011 0330   CL 103 10/29/2011 0330   CO2 22 10/29/2011 0330   GLUCOSE 156* 10/29/2011 0330   BUN 10 10/29/2011 0330   CREATININE 0.50 10/29/2011 0330   CALCIUM 7.6* 10/29/2011 0330   PROT 5.9* 10/28/2011 0500   ALBUMIN 1.0* 10/28/2011 0500   AST 40* 10/28/2011 0500   ALT 13 10/28/2011 0500   ALKPHOS 49 10/28/2011 0500   BILITOT 0.1* 10/28/2011 0500   GFRNONAA >90 10/29/2011 0330   GFRAA >90 10/29/2011 0330   hiv reactive cd4count < 10  MICRO: Blood Culture HCV negative HBsAg negative HBsAb negative HBcAb negative PJP positive AFB on bronchial wash is negative Blood culture pending BAL culture pending  Studies/Results: CXR 11/06:  The ET tube tip is above the carina.  There is a left IJ catheter with tip in the SVC.  The heart size appears normal.  Diffuse bilateral airspace opacities are again noted and appear unchanged from previous exam.  IMPRESSION:  1.  No change in bilateral airspace opacities compared with previous exam.  Assessment/Plan: 1) HIV/AIDS = CD4 count <10  and  viral load pending. Will likely start HAART at this hospitalization. Still waiting for VL and genotyping. Will need to discuss with patient if he is ready to start therapy. We will need case manager to help out with access to ARV and linking him to ID clinic. 2) acute respiratory distress 2/2 PCP pneumonia = continue with current dose of TMP/SMX at 15mg /kg and steroids. Since respiratory cultures are still negative, we can discontinue vanco and pip/tazo. 3)  diarrhea = please follow up on stool cultures for giardia, cryptosporidium,  4) malnutrition = continue with NG tube feeds until he is more alert to take in  foods/pills by mouth 5) OI prophylaxis = will decide if need to give azithromycin for MAC prophylaxis based upon CD4 count. 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   LOS: 2 days   Judyann Munson 10/29/2011, 1:49 PM

## 2011-10-29 NOTE — Progress Notes (Addendum)
Physical Therapy Evaluation Patient Details Name: Gerald Cox MRN: [id number] DOB: 03-18-1971 Today's Date: 10/29/2011  Problem List:  Patient Active Problem List  Diagnoses  . HIV (human immunodeficiency virus infection)  . Sepsis associated hypotension  . Acute respiratory failure  . Hyponatremia  . Septic shock  . ARDS (adult respiratory distress syndrome)  . Pneumonia    Past Medical History:  Past Medical History  Diagnosis Date  . No pertinent past medical history    Past Surgical History:  Past Surgical History  Procedure Date  . Other surgical history 10/27/11    per family pt was hospitilized due to cutting thigh with a chainsaw     PT Assessment/Plan/Recommendation PT Assessment Clinical Impression Statement: Expect pt to bounce back toward normal mobility quickly and with daughter's assist at home should be safe to return home with PT to address weakness and activity tolerance PT Recommendation/Assessment: Patient will need skilled PT in the acute care venue PT Problem List: Decreased strength;Decreased activity tolerance;Decreased balance;Decreased mobility;Cardiopulmonary status limiting activity Barriers to Discharge: None PT Therapy Diagnosis : Generalized weakness PT Plan PT Frequency: Min 3X/week PT Recommendation Follow Up Recommendations: Home health PT Equipment Recommended: Other (comment) (TBA) PT Goals  Acute Rehab PT Goals PT Goal Formulation: With patient Time For Goal Achievement: 7 days Pt will go Supine/Side to Sit: with supervision PT Goal: Supine/Side to Sit - Progress: Other (comment) Pt will Transfer Sit to Stand/Stand to Sit: with supervision PT Transfer Goal: Sit to Stand/Stand to Sit - Progress: Other (comment) Pt will Transfer Bed to Chair/Chair to Bed: with supervision PT Transfer Goal: Bed to Chair/Chair to Bed - Progress: Other (comment) Pt will Ambulate: >150 feet;with supervision;with least restrictive assistive  device PT Goal: Ambulate - Progress: Other (comment) Pt will Go Up / Down Stairs: 1-2 stairs;with supervision;with rail(s) PT Goal: Up/Down Stairs - Progress: Other (comment)  PT Evaluation Precautions/Restrictions  Precautions Precautions: Other (comment) (4L 02) Prior Functioning  Home Living Lives With: Family;Spouse;Daughter Receives Help From: Other (Comment);Family (daughter can assist after d/c as needed) Type of Home: House Home Layout: One level Home Access: Stairs to enter [employer] of Steps: 3 Prior Function Level of Independence: Independent with homemaking with ambulation;Independent with basic ADLs;Independent with transfers;Independent with gait;Other (comment) (worked) Able to [employer]?: Yes 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 Arousal/Alertness: Awake/alert Overall Cognitive Status: Appears within functional limits for tasks assessed Orientation Level: Oriented X4 Sensation/Coordination Coordination Gross Motor Movements are Fluid and Coordinated: Yes Extremity Assessment RUE Assessment RUE Assessment: Within Functional Limits LUE Assessment LUE Assessment: Within Functional Limits RLE Assessment RLE Assessment: Within Functional Limits LLE Assessment LLE Assessment: Within Functional Limits  (Generally weak at 4/5 overall due to paralytics used in ICU) Mobility (including Balance) Bed Mobility Bed Mobility: Yes Supine to Sit: 4: Min assist;HOB elevated (Comment degrees) (20 deg) Transfers Transfers: Yes Sit to Stand: 4: Min assist Sit to Stand Details (indicate cue type and reason): vc/tc's for hand placement; manual A for forward w/shift Ambulation/Gait Ambulation/Gait: Yes Ambulation/Gait Assistance: 4: Min assist;1: +1 Total assist (+1 for lines) Ambulation Distance (Feet): 80 Feet Assistive device: 1 person hand held assist Gait Pattern:  (generally steady bout with mild balance problems/weakness)  Posture/Postural  Control Posture/Postural Control: Postural limitations Postural Limitations: trunk weakness noted with patient trying to overcome the "hole in the bed" during scoot to EOB Exercise    End of Session PT - End of Session Activity Tolerance: Patient limited by fatigue (lightheadedness due to decreased  SaO2 in the 80's) Patient left: in bed Nurse Communication: Mobility status for ambulation General Behavior During Session: [facility] for tasks performed  Dorsey Authement, Eliseo Gum 10/29/2011, 5:26 PM

## 2011-10-29 NOTE — Procedures (Signed)
Extubation Procedure Note  Patient Details:   Name: Gerald Cox DOB: Feb 25, 1971 MRN: [id number]   Airway Documentation:  Airway 8 mm (Active)  Secured at (cm) 23 cm 10/29/2011  8:16 AM  Measured From Lips 10/28/2011  3:44 PM  Secured Location Center 10/29/2011  8:16 AM  Secured By [employer] 10/29/2011  8:16 AM  Tube Holder Repositioned Yes 10/29/2011  8:16 AM  Cuff Pressure (cm H2O) 18 cm H2O 10/29/2011  8:16 AM    Evaluation  O2 sats: stable throughout 91% on 4 LPM [place] Complications: No apparent complications Patient did tolerate procedure well. Bilateral Breath Sounds: Clear Suctioning: Airway Yes pt able to verbalize Oral and ETT suctioned prior to tube removed  Levada Schilling 10/29/2011, 11:15 AM

## 2011-10-29 NOTE — Progress Notes (Signed)
Jeremaih Klima,RN,BSN 1535 MET WITH PT WITH INTERPRETER AND CSW IN ATTENDENCE.  PT NEWLY DIAGNOSED WITH HIV, SUSPECT ADVANCED DISEASE.  DAUGHTER CASSANDRA IS AT BEDSIDE AND IS AWARE OF DIAGNOSIS.  THROUGH INTERPRETER DISCUSSED RESOURCES AVAILABLE TO PT AT DISCHARGE:  CSW TO REFER TO [facility] PROJECT FOR FOLLOW UP; NOTIFIED FINANCIAL COUNSELOR OF PT --MADISON WOODS TO FOLLOW UP WITH PT ASAP --SHE WILL ARRANGE FOR INTERPRETER [facility] SW DEPT AT EXT [id number].  PT HAS NO PCP PRESENTLY...UNCERTAIN IF PT WILL FOLLOW UP AT INFECTIOUS DISEASE CLINIC OR WILL NEED [facility] REFERRAL.  MD PLEASE ADVISE WHEN KNOWN.  PT WILL NEED EXTENSIVE EDUCATION ON DISEASE, MODES OF TRANSMISSION, ETC.  BEDSIDE NURSE STATES SHE WILL FOLLOW UP WITH PRINTED INFO AS AVAILABLE.  WILL FOLLOW AS PT PROGRESSES.

## 2011-10-30 DIAGNOSIS — J984 Other disorders of lung: Secondary | ICD-10-CM

## 2011-10-30 DIAGNOSIS — B20 Human immunodeficiency virus [HIV] disease: Secondary | ICD-10-CM

## 2011-10-30 DIAGNOSIS — B59 Pneumocystosis: Secondary | ICD-10-CM

## 2011-10-30 LAB — BASIC METABOLIC PANEL
Chloride: 106 mEq/L (ref 96–112)
Creatinine, Ser: 0.58 mg/dL (ref 0.50–1.35)
GFR calc Af Amer: >90 mL/min (ref >90)
GFR calc non Af Amer: >90 mL/min (ref >90)
Potassium: 3.9 mEq/L (ref 3.5–5.1)

## 2011-10-30 LAB — GLUCOSE, CAPILLARY
Glucose-Capillary: 115 mg/dL — ABNORMAL HIGH (ref 70–99)
Glucose-Capillary: 117 mg/dL — ABNORMAL HIGH (ref 70–99)
Glucose-Capillary: 91 mg/dL (ref 70–99)
Glucose-Capillary: 95 mg/dL (ref 70–99)
Glucose-Capillary: 98 mg/dL (ref 70–99)

## 2011-10-30 LAB — GIARDIA/CRYPTOSPORIDIUM SCREEN(EIA)

## 2011-10-30 LAB — CBC
HCT: 29.5 % — ABNORMAL LOW (ref 39.0–52.0)
Hemoglobin: 10.3 g/dL — ABNORMAL LOW (ref 13.0–17.0)
RDW: 13.7 % (ref 11.5–15.5)
WBC: 6.4 10*3/uL (ref 4.0–10.5)

## 2011-10-30 LAB — HIV 1/2 CONFIRMATION: HIV-1 antibody: POSITIVE

## 2011-10-30 MED ORDER — AZITHROMYCIN 600 MG PO TABS
1200.0000 mg | ORAL_TABLET | ORAL | Status: DC
  Administered 2011-10-31 – 2011-11-06 (×2): 1200 mg via ORAL
  Filled 2011-10-30 (×2): qty 2

## 2011-10-30 MED ORDER — ONDANSETRON HCL 4 MG/2ML IJ SOLN
INTRAMUSCULAR | Status: AC
  Administered 2011-10-30: 4 mg via INTRAVENOUS
  Filled 2011-10-30: qty 2

## 2011-10-30 MED ORDER — ONDANSETRON HCL 4 MG/2ML IJ SOLN
4.0000 mg | Freq: Four times a day (QID) | INTRAMUSCULAR | Status: DC | PRN
  Administered 2011-10-30 (×2): 4 mg via INTRAVENOUS
  Filled 2011-10-30: qty 2

## 2011-10-30 MED ORDER — SULFAMETHOXAZOLE-TMP DS 800-160 MG PO TABS
2.0000 | ORAL_TABLET | Freq: Three times a day (TID) | ORAL | Status: DC
  Administered 2011-10-30 – 2011-11-03 (×11): 2 via ORAL
  Administered 2011-11-03: 1 via ORAL
  Administered 2011-11-03 – 2011-11-04 (×4): 2 via ORAL
  Administered 2011-11-04: 1 via ORAL
  Administered 2011-11-05 – 2011-11-07 (×8): 2 via ORAL
  Filled 2011-10-30 (×29): qty 2

## 2011-10-30 MED ORDER — PREDNISONE 20 MG PO TABS
40.0000 mg | ORAL_TABLET | Freq: Two times a day (BID) | ORAL | Status: DC
  Administered 2011-10-30 – 2011-11-02 (×8): 40 mg via ORAL
  Filled 2011-10-30 (×14): qty 2

## 2011-10-30 NOTE — Progress Notes (Signed)
ANTIBIOTIC CONSULT NOTE - FOLLOW UP  Pharmacy Consult:  Septra Indication:  PCP PNA  No Known Allergies  Patient Measurements: Weight: 130 lb 1.1 oz (59 kg)   Vital Signs: Temp: 97.5 F (36.4 C) (11/08 0800) Temp src: Oral (11/08 0800) BP: 102/67 mmHg (11/08 0600) Pulse Rate: 86  (11/08 0600) Intake/Output from previous day: 11/07 0701 - 11/08 0700 In: 3947 [P.O.:720; I.V.:1542; NG/GT:155; IV Piggyback:1530] Out: 5980 [Urine:5530; Emesis/NG output:125; Stool:325] Intake/Output from this shift: Total I/O In: -  Out: 975 [Urine:975]  Labs:  [facility] 10/29/11 0330 10/28/11 0500 10/27/11 2234 10/27/11 1050  WBC 10.4 4.1 -- 6.0  HGB 10.4* 11.0* -- 9.5*  PLT 296 278 -- 302  LABCREA -- -- -- 51.94  CREATININE 0.50 0.46* 0.43* --   CrCl is unknown because there is no height on file for the current visit. No results found for this basename: VANCOTROUGH:2,VANCOPEAK:2,VANCORANDOM:2,GENTTROUGH:2,GENTPEAK:2,GENTRANDOM:2,TOBRATROUGH:2,TOBRAPEAK:2,TOBRARND:2,AMIKACINPEAK:2,AMIKACINTROU:2,AMIKACIN:2, in the last 72 hours   Microbiology: Recent Results (from the past 720 hour(s))  CULTURE, BLOOD (ROUTINE X 2)     Status: Normal (Preliminary result)   Collection Time   10/27/11  4:15 AM      Component Value Range Status Comment   Specimen Description BLOOD ARM RIGHT   Final    Special Requests     Final    Value: BOTTLES DRAWN AEROBIC AND ANAEROBIC 8CC  PT ON FLAGYL,CIPRO   Setup Time [id number]   Final    Culture     Final    Value:        BLOOD CULTURE RECEIVED NO GROWTH TO DATE CULTURE WILL BE HELD FOR 5 DAYS BEFORE ISSUING A FINAL NEGATIVE REPORT   Report Status PENDING   Incomplete   CULTURE, BLOOD (ROUTINE X 2)     Status: Normal (Preliminary result)   Collection Time   10/27/11  4:15 AM      Component Value Range Status Comment   Specimen Description BLOOD RIGHT ARM   Final    Special Requests     Final    Value: BOTTLES DRAWN AEROBIC AND ANAEROBIC 10CC EACH PATIENT ON  FOLLOWING CIPRO,FLAGYL   Setup Time [id number]   Final    Culture     Final    Value:        BLOOD CULTURE RECEIVED NO GROWTH TO DATE CULTURE WILL BE HELD FOR 5 DAYS BEFORE ISSUING A FINAL NEGATIVE REPORT   Report Status PENDING   Incomplete   URINE CULTURE     Status: Normal   Collection Time   10/27/11  6:20 AM      Component Value Range Status Comment   Specimen Description URINE, RANDOM   Final    Special Requests NONE   Final    Setup Time [id number]   Final    Colony Count NO GROWTH   Final    Culture NO GROWTH   Final    Report Status 10/28/2011 FINAL   Final   MRSA PCR SCREENING     Status: Normal   Collection Time   10/27/11  9:42 AM      Component Value Range Status Comment   MRSA by PCR NEGATIVE  NEGATIVE  Final   AFB CULTURE WITH SMEAR     Status: Normal (Preliminary result)   Collection Time   10/27/11  3:22 PM      Component Value Range Status Comment   Specimen Description BRONCHIAL WASHINGS   Final    Special Requests NONE   Final  ACID FAST SMEAR NO ACID FAST BACILLI SEEN   Final    Culture     Final    Value: CULTURE WILL BE EXAMINED FOR 6 WEEKS BEFORE ISSUING A FINAL REPORT   Report Status PENDING   Incomplete   PNEUMOCYSTIS JIROVECI SMEAR BY DFA     Status: Normal   Collection Time   10/27/11  3:22 PM      Component Value Range Status Comment   Specimen Source-PJSRC BRONCHIAL WASHINGS   Final    Pneumocystis jiroveci Ag POSITIVE   Final   FUNGUS CULTURE W SMEAR     Status: Normal (Preliminary result)   Collection Time   10/27/11  3:22 PM      Component Value Range Status Comment   Specimen Description BRONCHIAL WASHINGS   Final    Special Requests ADDED ON [id number] @1649    Final    Fungal Smear NO YEAST OR FUNGAL ELEMENTS SEEN   Final    Culture CULTURE IN PROGRESS FOR FOUR WEEKS   Final    Report Status PENDING   Incomplete   CULTURE, RESPIRATORY     Status: Normal   Collection Time   10/27/11  3:22 PM      Component Value Range Status Comment    Specimen Description BRONCHIAL WASHINGS   Final    Special Requests ADDED ON [id number] @1658    Final    Gram Stain     Final    Value: RARE WBC PRESENT, PREDOMINANTLY PMN     NO SQUAMOUS EPITHELIAL CELLS SEEN     NO ORGANISMS SEEN   Culture NO GROWTH 2 DAYS   Final    Report Status 10/29/2011 FINAL   Final   AFB CULTURE, BLOOD     Status: Normal (Preliminary result)   Collection Time   10/28/11  5:15 AM      Component Value Range Status Comment   Specimen Description BLOOD LEFT   Final    Special Requests CVC IBV   Final    Culture     Final    Value: CULTURE WILL BE EXAMINED FOR 6 WEEKS BEFORE ISSUING A FINAL REPORT   Report Status PENDING   Incomplete     Anti-infectives     Start     Dose/Rate Route Frequency Ordered Stop   10/27/11 1300   vancomycin (VANCOCIN) IVPB 1000 mg/200 mL premix  Status:  Discontinued        1,000 mg 200 mL/hr over 60 Minutes Intravenous Every 8 hours 10/27/11 1028 10/29/11 1051   10/27/11 1300   piperacillin-tazobactam (ZOSYN) IVPB 3.375 g  Status:  Discontinued        3.375 g 100 mL/hr over 30 Minutes Intravenous 3 times per day 10/27/11 1028 10/29/11 1051   10/27/11 1100  sulfamethoxazole-trimethoprim (BACTRIM) 320 mg in dextrose 5 % 500 mL IVPB       320 mg 346.7 mL/hr over 90 Minutes Intravenous 3 times per day 10/27/11 1028     10/27/11 0900   Levofloxacin (LEVAQUIN) IVPB 750 mg  Status:  Discontinued     Comments: STAT!!!!!!!!!!!      750 mg 100 mL/hr over 90 Minutes Intravenous Every 24 hours 10/27/11 0857 10/28/11 1144   10/27/11 0900   sulfamethoxazole-trimethoprim (BACTRIM) 5 mg/kg/day in dextrose 5 % 500 mL IVPB  Status:  Discontinued     Comments: STAT for PCOP empiric pharamcy dose all now please including load      5 mg/kg/day 333.3 mL/hr  over 90 Minutes Intravenous 3 times per day 10/27/11 0857 10/27/11 1029   10/27/11 0500   vancomycin (VANCOCIN) IVPB 1000 mg/200 mL premix        1,000 mg 200 mL/hr over 60 Minutes Intravenous   Once 10/27/11 0454 10/27/11 0649   10/27/11 0500   piperacillin-tazobactam (ZOSYN) IVPB 3.375 g        3.375 g 12.5 mL/hr over 240 Minutes Intravenous  Once 10/27/11 0454 10/27/11 0914          Assessment: 40 YOM to continue on Septra for PCP PNA.  Patient with stable renal function; K+ WNL.  Noted plan to change to PO once PO intake improves.   Plan:  Continue Septra 320 mg IV Q8H (15 mg/kg/day TMP).  Follow-up with OI px plans.  Follow-up daily.  Phillips Climes Dien 10/30/2011,9:57 AM

## 2011-10-30 NOTE — Plan of Care (Signed)
Problem: Phase II Progression Outcomes Goal: Date pt extubated/weaned off vent Outcome: Completed/Met Date Met:  10/29/11 10/29/11

## 2011-10-30 NOTE — Progress Notes (Signed)
Follow up - Critical Care Medicine Note  Patient Details:    Zyron Deeley is an 40 y.o. male. 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  Is now known to be HIV+ with PCP identified on BAL.  Due to fast improvement, are leaning away from shock due to infectious cause.  Lines/tubes LIJ 11/5 >> 11/8 R-radial art line 11/5>> 11/7 Foley 11/5 >> 11/7 ETT 11/5 >> 11/7  Microbiology/Sepsis markers: PCT 5 (11/5) >> 3.39 (11/6) Lactate 0.8 (11/5) >> 1.7 (11/6) HIV >> positive CD4 >> <10 Urine Cx (11/5) >> Negative MRSA >> Negative AFB Blooc Cx (11/5) >> Stool O+P (11/5)>> AFB Stain (11/5) >> Negative RPR >> Negative Hep B >> Negative Hep C >> Negative  Anti-infectives:  Levaquin (11/5) >>11/6 Zosyn (11/5) >> 11/7 Vancomycin (11/5) >>11/7 Bactrim (11/5) >>  Best Practice/Protocols:  Protonix SQ Heparin  Consults: ID  Studies/events: Bronchoscopy (11/5) >> No lesions, no secretions, BAL performed 11/6: Off pressors with MAP > 65 11/7: Extubated  Subjective:    Overnight Issues: Tolerating PO, some problems with nausea overnight.  Case management helping with OP resources.  Objective:  Vital signs  Temp:  [97.1 F (36.2 C)-98.3 F (36.8 C)] 97.4 F (36.3 C) (11/08 0458) Pulse Rate:  [73-112] 86  (11/08 0600) Resp:  [18-39] 25  (11/08 0600) BP: (73-116)/(53-84) 102/67 mmHg (11/08 0600) SpO2:  [88 %-99 %] 97 % (11/08 0600) Arterial Line BP: (93-121)/(51-69) 121/69 mmHg (11/07 1100) FiO2 (%):  [29.9 %-40.1 %] 30 % (11/07 1030) Weight:  [130 lb 1.1 oz (59 kg)] 130 lb 1.1 oz (59 kg) (11/08 0500)  Hemodynamic parameters for last 24 hours:     Intake/Output Summary (Last 24 hours) at 10/30/11 0713 Last data filed at 10/30/11 0500  Gross per 24 hour  Intake   3947 ml  Output   5980 ml  Net  -2033 ml    Physical Exam:    Gen: Awake and alert, responds HEENT: Mucous membranes moist, LIJ in place, tube in place CV: RRR Resp: Mildly coarse throughout, somewhat improved from yesterday Abd: SNTND Ext: Warm and well perfused Neuro:  Alert and oriented x3  Ventilator  Settings: Vent Mode:  [-] CPAP FiO2 (%):  [29.9 %-40.1 %] 30 % PEEP:  [5 cmH20] 5 cmH20 Pressure Support:  [5 cmH20] 5 cmH20  LAB RESULTS BMET Pending CBC Pending   Assessment/Plan:   NEURO     Plan: No active issues  PULM  ARDS (resolved), Pneumonia, Acute respiratory failure (resolved)   Plan:  - Cont Abx for PCP PNA - Titrate O2 as needed, likely to off  CARDIO  Septic Shock (resolved)   Plan: - bp stable off pressors  RENAL  Hyponatremia   Plan: Follow serial CBCs and encourage PO intake  GI  Nausea   Plan:Zofran PRN  ID  HIV, PCP PNA   Plan: Cultures as above - HIV treatment per ID, CD4 <10 - VIral Load pending - On Bactrim for PCP - Vanc/Zosyn d/c per ID - isolation d/c due to negative AFB from BAL - MAC PPx per ID  HEME  No active issues   Plan: na  ENDO Blood glucose control   Plan: mildly elevated, will cont to monitor.  May need therapy depending on duration of steroid therapy  Global Issues  Feel is ready for transfer to floor.  IM Teaching  Service to assume care. Social work on board     LOS: 3 days    [name],[name] 10/30/2011   *Care during the described time interval was provided by me and/or other providers on the critical care team.  I have reviewed this patient's available data, including medical history, events of note, physical examination and test results as part of my evaluation.  Trannie Bardales V.

## 2011-10-30 NOTE — Progress Notes (Signed)
Infectious Diseases Progress Note:  ID: 40yo [place] Male newly diagnosed HIV+ (CD4 < 10 /VL pending) presents with acute respiratory failure 2/2 PCP pneumonia s/p intubation and pressors   24hr events/subjective: patient doing well since extubation  approx 24hrs ago. Remains afebrile. He is able to eat without difficulty, but subscribes to having some nausea  ABtx:  bactrim 15mg /kg D4, pred 40mg  BID Medications: I have reviewed the patient's current medications.  Objective: Vital signs in last 24 hours: Temp:  [97.4 F (36.3 C)-98.3 F (36.8 C)] 97.4 F (36.3 C) (11/08 1201) Pulse Rate:  [81-115] 115  (11/08 1130) Resp:  [17-39] 31  (11/08 1130) BP: (73-122)/(54-84) 107/69 mmHg (11/08 1130) SpO2:  [88 %-98 %] 90 % (11/08 1130) Weight:  [59 kg (130 lb 1.1 oz)] 130 lb 1.1 oz (59 kg) (11/08 0500) Weight change: -6.3 kg (-13 lb 14.2 oz) Last BM Date: 10/29/11  Gen: awake sitting in bed, answers questions appropriately > 75% of time HEENT: pupils PERRLA, no scleral icterus, mild thrush on tonge Neck = supple, (left ij pulled) Pulm= bilateral rhonchi but improved from yesterday Heart= tachy, nl s1, s2. No g/m/r Abd= decreased bowel sounds, non-distended, no HSM Ext = warm, no c/c/e Skin = no rash Neuro = Ax O by 2, CN2-12 intact, moves all limbs  Lab Results: CBC    Component Value Date/Time   WBC 6.4 10/30/2011 0800   RBC 3.37* 10/30/2011 0800   HGB 10.3* 10/30/2011 0800   HCT 29.5* 10/30/2011 0800   PLT 317 10/30/2011 0800   MCV 87.5 10/30/2011 0800   MCH 30.6 10/30/2011 0800   MCHC 34.9 10/30/2011 0800   RDW 13.7 10/30/2011 0800   LYMPHSABS 0.3* 10/27/2011 1050   MONOABS 0.1 10/27/2011 1050   EOSABS 0.0 10/27/2011 1050   BASOSABS 0.0 10/27/2011 1050   TLC: 300 CMP     Component Value Date/Time   NA 136 10/30/2011 0800   K 3.9 10/30/2011 0800   CL 106 10/30/2011 0800   CO2 24 10/30/2011 0800   GLUCOSE 84 10/30/2011 0800   BUN 11 10/30/2011 0800   CREATININE 0.58 10/30/2011 0800     CALCIUM 8.1* 10/30/2011 0800   PROT 5.9* 10/28/2011 0500   ALBUMIN 1.0* 10/28/2011 0500   AST 40* 10/28/2011 0500   ALT 13 10/28/2011 0500   ALKPHOS 49 10/28/2011 0500   BILITOT 0.1* 10/28/2011 0500   GFRNONAA >90 10/30/2011 0800   GFRAA >90 10/30/2011 0800   hiv reactive cd4count < 10  MICRO: Blood Culture HCV negative HBsAg negative HBsAb negative HBcAb negative PJP positive AFB on bronchial wash is negative Blood culture pending BAL culture pending  Assessment/Plan: 1) 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 Still waiting for VL and genotyping. Will need to discuss with patient if he is ready to start therapy. The second portion is that we will see if we will be able to have him discharged on a month's supply of ARV to bridge him to when he will be seen in the ID clinic. 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  2) acute respiratory distress 2/2 PCP pneumonia = currently on TMP/SMX at 15mg /kg and pred 40mg  BID. Since patient can eat and swallow pills without difficulty, we will switch his IV bactrim to bactrim DS take 2 tablets TID, for a total treatment period of 21 days. He should be switched to Prednisone 40mg  DAILY on 11/12, for which he  should take for 5 days, then decreased to 20mg  daily for the remaining treatment period (total or 21 days) . Thereafter the patient should be placed on bactrim ss for prophylaxis.  3)  diarrhea = please follow up on stool cultures for giardia, cryptosporidium.  4) malnutrition = encourage good po intake, possibly nutritional supplements, like ensure? 5) OI prophylaxis = please start azithromycin 1200mg  Qweekly . 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   LOS: 3 days   Judyann Munson 10/30/2011, 12:09 PM

## 2011-10-30 NOTE — Progress Notes (Signed)
UR Completed.   Gerald Cox 10/30/2011  

## 2011-10-30 NOTE — Progress Notes (Signed)
Due to language barrier, an interpreter was present during the history-taking and subsequent discussion (and for part of the physical exam) with this patient. Interpreter Shamar Kracke Namihira 10/30/2011    

## 2011-10-30 NOTE — Progress Notes (Signed)
Physical Therapy Treatment Patient Details Name: Benito Lemmerman MRN: [id number] DOB: 1971/02/20 Today's Date: 10/30/2011  PT Assessment/Plan  PT - Assessment/Plan Comments on Treatment Session: Pt. limited by nausea today.  Did not progress ambulation secondary to nausea.   PT Plan: Discharge plan remains appropriate;Frequency remains appropriate Equipment Recommended: Rolling walker with 5" wheels;3 in 1 bedside comode PT Goals  Acute Rehab PT Goals PT Goal: Supine/Side to Sit - Progress: Progressing toward goal PT Transfer Goal: Sit to Stand/Stand to Sit - Progress: Progressing toward goal PT Transfer Goal: Bed to Chair/Chair to Bed - Progress: Progressing toward goal PT Goal: Ambulate - Progress: Progressing toward goal PT Goal: Up/Down Stairs - Progress: Other (comment)  PT Treatment Precautions/Restrictions  Precautions Precautions: Fall Precaution Comments: Slightly impulsive Restrictions Weight Bearing Restrictions: No Mobility (including Balance) Bed Mobility Bed Mobility: Yes Supine to Sit: 4: Min assist;HOB elevated (Comment degrees) (20 degrees) Transfers Sit to Stand: 4: Min assist;From bed;With upper extremity assist Sit to Stand Details (indicate cue type and reason): Pt. needed cues for hand placement as well as steadying assist once in standing as pt. leaning backward upon initial standing. Ambulation/Gait Ambulation/Gait Assistance: 4: Min assist Ambulation/Gait Assistance Details (indicate cue type and reason): +1 for lines, handheld assist of 1 with tactile assist to maintain postural stability.  ambulated into bathroom with min assist for stand to sit on toilet and had BM. Cleaned self I.  Min assist to stand from toilet with rail.  Ambulated back to chair instead of into hallway secondary to nausea.  Pt. spitting up phlegm.  Called nsg. to bring nausea med.   Ambulation Distance (Feet): 40 Feet (feet total.  Pt. actually did 75feet x 2.) Assistive device:  1 person hand held assist Gait Pattern: Shuffle;Decreased stride length;Trunk flexed (Pt. staggers at times secondary to posterior lean.  ) Stairs: No Wheelchair Mobility Wheelchair Mobility: No  Posture/Postural Control Posture/Postural Control: Postural limitations Postural Limitations: Postural instability noted with increased sway all directions.  Cannot tolerate challenges. Balance Balance Assessed: No Exercise  General Exercises - Lower Extremity Long Arc Quad: AAROM;Strengthening;Both;5 reps;Seated Heel Slides: AAROM;Strengthening;Both;10 reps;Seated End of Session PT - End of Session Equipment Utilized During Treatment: Gait belt Activity Tolerance: Other (comment) (Limited by nausea) Patient left: in chair;with call bell in reach;with family/visitor present (friend present and assisted with translation) Nurse Communication: Mobility status for ambulation General Behavior During Session: [facility] for tasks performed Cognition: [facility] for tasks performed  INGOLD,Kidus Delman 10/30/2011, 12:32 PM

## 2011-10-30 NOTE — Progress Notes (Signed)
CSW received phone call from [facility] ([facility]).  [facility] stated they prefer patients to call them directly to make referrals and provided the spanish speaking referral number.  CSW provided this information for the patient.  CSW to continue to follow and assist as needed.

## 2011-10-31 ENCOUNTER — Encounter (HOSPITAL_COMMUNITY): Payer: Self-pay | Admitting: Internal Medicine

## 2011-10-31 DIAGNOSIS — B59 Pneumocystosis: Secondary | ICD-10-CM

## 2011-10-31 DIAGNOSIS — B20 Human immunodeficiency virus [HIV] disease: Secondary | ICD-10-CM

## 2011-10-31 LAB — BASIC METABOLIC PANEL
Calcium: 8.4 mg/dL (ref 8.4–10.5)
GFR calc Af Amer: >90 mL/min (ref >90)
GFR calc non Af Amer: >90 mL/min (ref >90)
Glucose, Bld: 152 mg/dL — ABNORMAL HIGH (ref 70–99)
Potassium: 4.3 mEq/L (ref 3.5–5.1)
Sodium: 134 mEq/L — ABNORMAL LOW (ref 135–145)

## 2011-10-31 LAB — GLUCOSE, CAPILLARY
Glucose-Capillary: 141 mg/dL — ABNORMAL HIGH (ref 70–99)
Glucose-Capillary: 173 mg/dL — ABNORMAL HIGH (ref 70–99)

## 2011-10-31 LAB — CBC
Hemoglobin: 10.8 g/dL — ABNORMAL LOW (ref 13.0–17.0)
MCH: 30.4 pg (ref 26.0–34.0)
MCHC: 34.3 g/dL (ref 30.0–36.0)
Platelets: 299 10*3/uL (ref 150–400)
RDW: 13.7 % (ref 11.5–15.5)

## 2011-10-31 LAB — CRYPTOSPORIDIUM SMEAR, FECAL

## 2011-10-31 MED ORDER — THIAMINE HCL 100 MG/ML IJ SOLN
100.0000 mg | Freq: Every day | INTRAMUSCULAR | Status: DC
  Filled 2011-10-31 (×6): qty 1

## 2011-10-31 MED ORDER — LORAZEPAM 1 MG PO TABS: 1.0000 mg | ORAL_TABLET | Freq: Four times a day (QID) | ORAL | Status: AC | PRN

## 2011-10-31 MED ORDER — FOLIC ACID 1 MG PO TABS
1.0000 mg | ORAL_TABLET | Freq: Every day | ORAL | Status: DC
  Administered 2011-11-01 – 2011-11-07 (×8): 1 mg via ORAL
  Filled 2011-10-31 (×9): qty 1

## 2011-10-31 MED ORDER — LORAZEPAM 2 MG/ML IJ SOLN: 1.0000 mg | Freq: Four times a day (QID) | INTRAMUSCULAR | Status: AC | PRN

## 2011-10-31 MED ORDER — BOOST / RESOURCE BREEZE PO LIQD
1.0000 | Freq: Three times a day (TID) | ORAL | Status: DC
  Administered 2011-10-31 – 2011-11-07 (×18): 1 via ORAL
  Filled 2011-10-31 (×2): qty 1

## 2011-10-31 MED ORDER — THERA M PLUS PO TABS
1.0000 | ORAL_TABLET | Freq: Every day | ORAL | Status: DC
  Administered 2011-10-31 – 2011-11-07 (×8): 1 via ORAL
  Filled 2011-10-31 (×9): qty 1

## 2011-10-31 MED ORDER — VITAMIN B-1 100 MG PO TABS
100.0000 mg | ORAL_TABLET | Freq: Every day | ORAL | Status: DC
  Administered 2011-11-01 – 2011-11-07 (×8): 100 mg via ORAL
  Filled 2011-10-31 (×10): qty 1

## 2011-10-31 NOTE — Progress Notes (Signed)
Internal Medicine Attending  Date: 10/31/2011  Patient name: Gerald Cox Medical record number: [id number] Date of birth: 12/07/1971 Age: 40 y.o. Gender: male  History: I saw and evaluated the patient, reviewed the chart, and discussed his care with resident Dr. Candy Sledge.  Patient is a 40 year old man admitted to the critical care medicine service with pneumocystis pneumonia, respiratory failure, and a new diagnosis of HIV.  Patient was extubated on 10/29/2011, and currently has stable respiratory status on nasal cannula oxygen.  Patient has now been transferred to our service for further medical management.  Exam:   Filed Vitals:   10/30/11 1500 10/30/11 1700 10/30/11 2150 10/31/11 0630  BP:  104/78 109/67 100/58  Pulse: 91  77 86  Temp: 97.6 F (36.4 C)  96.7 F (35.9 C) 96.6 F (35.9 C)  TempSrc:   Oral Oral  Resp: 22  20 22   Weight:    122 lb 9.2 oz (55.6 kg)  SpO2: 95%  99% 98%   Gen.: alert, no distress Lungs: good bilateral air movement with a few scattered rhonchi Heart: regular rhythm, S1-S2, no S3, no S4, no murmurs;  Abdomen: soft, nontender, bowel sounds present, no hepatosplenomegaly Extremities: no lower extremity edema.   Lab results:  Basic Metabolic Panel:  Basename 10/31/11 0555 10/30/11 0800 10/29/11 0330  NA 134* 136 --  K 4.3 3.9 --  CL 102 106 --  CO2 22 24 --  GLUCOSE 152* 84 --  BUN 11 11 --  CREATININE 0.52 0.58 --  CALCIUM 8.4 8.1* --  MG -- -- 2.3  PHOS -- -- 2.9   CBC:  Basename 10/31/11 0555 10/30/11 0800  WBC 5.0 6.4  NEUTROABS -- --  HGB 10.8* 10.3*  HCT 31.5* 29.5*  MCV 88.7 87.5  PLT 299 317   CBG:  Basename 10/31/11 1215 10/31/11 0825 10/30/11 2147 10/30/11 1739 10/30/11 1138 10/30/11 0759  GLUCAP 141* 201* 119* 98 95 91    Assessment & Plan by Problem:  #1. Pneumocystis pneumonia.  Plan is continue Bactrim and prednisone; supplement oxygen and follow oxygen saturation.   #2. HIV (human immunodeficiency virus  infection).  ID consult will guide choice of anti-retroviral therapy.   #3.  Hyperglycemia.  Follow blood glucose; use sliding scale insulin as indicated; check hemoglobin A1C.  #4.  See note by resident Dr. Candy Sledge for assessment and plan for other problems.

## 2011-10-31 NOTE — Progress Notes (Signed)
Clinical Social Work-CSW received report from PCCM CSW as well as phone call from [facility] case worker re medicaid status . CSW discussed case with MD, contacted interpreting service and contacted financial counselor. Financial counselor will be meeting with pt and interpreter to discuss/initiate medicaid process. CSW will notify [facility] of medicaid  update. CSW will continue to follow for psychosocial needs. Jodean Lima, [phone]

## 2011-10-31 NOTE — Progress Notes (Signed)
Due to language barrier, an interpreter was present during the history-taking and subsequent discussion (and for part of the physical exam) with this patient. Interpreter Wyvonnia Dusky 10/31/2011

## 2011-10-31 NOTE — Progress Notes (Addendum)
Nutrition Follow-up/Consult  Received consult due to unintentional weight loss PTA.  Please see Nutrition Assessment in Progress Notes dated 11/6 for details regarding initial nutrition assessment.  Previously, patient was on the ventilator, receiving TF to meet nutrition needs.  Patient has since been extubated and started on a regular diet.  Diet:  Regular    PO's:  75% meal completion  Weight down to 55.6 kg, noted 10 pound weight loss over the past 2 months PTA.  8% weight loss.  Unsure of actual height, therefore, unable to accurately calculate BMI.  Re-estimated needs:  1800-2000 kcals, 85-100 grams protein daily for repletion of nutrition stores.  Nutrition Dx:  Increased protein-energy needs related to catabolic illness as evidenced by estimated nutrition needs 32-36 kcals/kg and 1.5-1.8 grams protein/kg.  New goal:  PO intake to meet at least 90% of estimated needs to prevent further weight loss.  Monitor:  Adequacy of oral intake, weight trend.  Plan:   Resource Breeze po TID to maximize oral intake.  MVI daily.

## 2011-10-31 NOTE — Progress Notes (Signed)
Received order for outpatient assistance and management. Actual need unclear as CSW has made contact with [facility] and MD notes plan for pt to be followed at ID clinic. CM unable to assist with HIV meds. Please contact CM for specific case management needs. Will call MD for clarification of needs.  CRoyal RN MPH

## 2011-10-31 NOTE — Progress Notes (Signed)
Infectious Diseases Progress Note:  ID: 40yo [place] Male newly diagnosed HIV+ (CD4 < 10 /VL pending) presents with acute respiratory failure 2/2 PCP pneumonia s/p intubation and pressors now extubated, and normotensive   24hr events/subjective:  Remains afebrile. He has good appetite, eating meals his family has provided. Still requiring supplemental oxygen. No nausea, improved diarrhea.  Using an interpreter: patient does not have green card thus will not be candidate for medicaid. However, patient will need CW help to process ADAP paperwork, so that he can get health care coverage for HAART, and other HIV related services  ABtx:  bactrim 15mg /kg D5, pred 40mg  BID Medications: I have reviewed the patient's current medications.  Objective: Vital signs in last 24 hours: Temp:  [96.6 F (35.9 C)-97.6 F (36.4 C)] 96.6 F (35.9 C) (11/09 0630) Pulse Rate:  [77-96] 86  (11/09 0630) Resp:  [20-22] 22  (11/09 0630) BP: (100-109)/(58-78) 100/58 mmHg (11/09 0630) SpO2:  [95 %-99 %] 98 % (11/09 0630) Weight:  [55.6 kg (122 lb 9.2 oz)] 122 lb 9.2 oz (55.6 kg) (11/09 0630) Weight change: -3.4 kg (-7 lb 7.9 oz) Last BM Date: 10/29/11  Gen: awake sitting in bed, answers questions appropriately > 75% of time HEENT: pupils PERRLA, no scleral icterus, mild thrush on tonge Neck = supple, (left ij pulled) Pulm= mild  bilateral rhonchi, no acc. Muscle use Heart= tachy, nl s1, s2. No g/m/r Abd= decreased bowel sounds, non-distended, no HSM Ext = warm, no c/c/e Skin = no rash Neuro = Ax O by 2, CN2-12 intact, moves all limbs  Lab Results: CBC    Component Value Date/Time   WBC 5.0 10/31/2011 0555   RBC 3.55* 10/31/2011 0555   HGB 10.8* 10/31/2011 0555   HCT 31.5* 10/31/2011 0555   PLT 299 10/31/2011 0555   MCV 88.7 10/31/2011 0555   MCH 30.4 10/31/2011 0555   MCHC 34.3 10/31/2011 0555   RDW 13.7 10/31/2011 0555   LYMPHSABS 0.3* 10/27/2011 1050   MONOABS 0.1 10/27/2011 1050   EOSABS 0.0 10/27/2011  1050   BASOSABS 0.0 10/27/2011 1050   TLC: 300 CMP     Component Value Date/Time   NA 134* 10/31/2011 0555   K 4.3 10/31/2011 0555   CL 102 10/31/2011 0555   CO2 22 10/31/2011 0555   GLUCOSE 152* 10/31/2011 0555   BUN 11 10/31/2011 0555   CREATININE 0.52 10/31/2011 0555   CALCIUM 8.4 10/31/2011 0555   PROT 5.9* 10/28/2011 0500   ALBUMIN 1.0* 10/28/2011 0500   AST 40* 10/28/2011 0500   ALT 13 10/28/2011 0500   ALKPHOS 49 10/28/2011 0500   BILITOT 0.1* 10/28/2011 0500   GFRNONAA >90 10/31/2011 0555   GFRAA >90 10/31/2011 0555   hiv reactive cd4count < 10 VL pending  MICRO: Blood Culture HCV negative HBsAg negative HBsAb negative HBcAb negative PJP positive AFB on bronchial wash is negative Blood culture pending BAL culture pending  Assessment/Plan: 1) 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 Still waiting for VL and genotyping. We are working on starting his ADAP paperwork. Plan is that [place] from [employer], who is spanish speaking, to help patient with necessary paperwork. Once this has been establish, plus return of labs (VL ,and genotype), we will initiate ART.    HIV RF:   unprotected sex with sex workers many years ago, in the [place].  2) acute respiratory distress 2/2 PCP pneumonia = currently on bactrim DS take 2 tablets TID, for a total treatment period of  21 days. He should be switched to Prednisone 40mg  DAILY on 11/12, for which he should take for 5 days, then decreased to 20mg  daily for the remaining treatment period (total or 21 days) . Thereafter the patient should be placed on bactrim ss for prophylaxis.  3)  diarrhea =  Giardia, cryptosporidium negative. Not completely sure if patient has diarrhea. It appears that it has improved. 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   LOS: 4 days   Judyann Munson 10/31/2011, 12:28 PM

## 2011-10-31 NOTE — Progress Notes (Signed)
Subjective: 40 yo old Spanish speaking man with minimal recorded PMH presented to ED 10/27/11 with fever, shortness of breath, cough and fatigue. Symptoms started approximately 1 mo ago with fevers, chills and cough. Pt originally thought he had a cold and tried OTC cough medicine. At first, he was able to continue working but his symptoms continued to get worse. Pt reports he lost his appetite and lost approximately 10 lbs over two months. Symptoms progressed to include severe SOB and weakness/fatigue.  ED notes suggest pt also experienced 2 month history of diarrhea, abdominal pain and night sweats.  Upon presentation to ED, pt was febrile (to 102F), tachycardic (HR 100s), tachypneic (RR 40s) and hypoxic (O2sats down to 70s). Pt was intubated and started on vasopressors. CXR showed patchy airspace opacity of the left greater than right lungs consistent with pneumonia.  BAL results confirmed PCP pneumonia. HIV rapid test was positive. Pt CD4 count <10 (viral load/phenotype pending).  BAL negative for AFB.   Pt was weaned off of pressors and extubated on 11/7. Pt has been improving and was transferred to our service on 10/30/11.    This morning (10/31/11): Pt reports he may have felt a bit feverish this morning. He experiences productive cough with yellow sputum occasionally. He states he feels some tenderness in the chest and epigastric area when he coughs.  He endorses occasional shortness of breath and feels agitated during those events.  He has been nauseated but has not vomited.  He was able to tolerate breakfast and is keeping it down. He did have diarrhea the night prior (10/29/11) but has not had a bowel movement since.   Pt has not been up and out of bed today but was working with PT yesterday Pt denies chest pain, HA, dizziness, lightheadedness, sore throat, abdominal pain, dysuria or other urinary symptoms, rash.    Objective: Vital signs in last 24 hours: Filed Vitals:   10/30/11 1500 10/30/11  1700 10/30/11 2150 10/31/11 0630  BP:  104/78 109/67 100/58  Pulse: 91  77 86  Temp: 97.6 F (36.4 C)  96.7 F (35.9 C) 96.6 F (35.9 C)  TempSrc:   Oral Oral  Resp: 22  20 22   Weight:    122 lb 9.2 oz (55.6 kg)  SpO2: 95%  99% 98%   Weight change: -7 lb 7.9 oz (-3.4 kg)  Intake/Output Summary (Last 24 hours) at 10/31/11 1340 Last data filed at 10/31/11 0850  Gross per 24 hour  Intake    240 ml  Output   4010 ml  Net  -3770 ml   Physical Exam:  General: diaphoretic, man resting in bed HEENT: PERRL, EOMI, eyes are dilated.  no scleral icterus, no thrush in mouth Cardiac: RRR, no rubs, murmurs or gallops Pulm: diffusely rhonchorous with the bases worse than the apicies , moving normal volumes of air Abd: soft, nontender, nondistended, BS present Ext: warm and well perfused, no pedal edema Neuro: alert. cranial nerves II-XII grossly intact, No focal neurologic deficits.    Lab Results: Basic Metabolic Panel:  Lab 10/31/11 0454 10/30/11 0800 10/29/11 0330  NA 134* 136 --  K 4.3 3.9 --  CL 102 106 --  CO2 22 24 --  GLUCOSE 152* 84 --  BUN 11 11 --  CREATININE 0.52 0.58 --  CALCIUM 8.4 8.1* --  MG -- -- 2.3  PHOS -- -- 2.9   Liver Function Tests:  Lab 10/28/11 0500 10/27/11 0345  AST 40* 94*  ALT 13  21  ALKPHOS 49 60  BILITOT 0.1* 0.3  PROT 5.9* 7.7  ALBUMIN 1.0* 1.6*   CBC:  Lab 10/31/11 0555 10/30/11 0800 10/27/11 1050 10/27/11 0345  WBC 5.0 6.4 -- --  NEUTROABS -- -- 5.6 6.1  HGB 10.8* 10.3* -- --  HCT 31.5* 29.5* -- --  MCV 88.7 87.5 -- --  PLT 299 317 -- --   Cardiac Enzymes:  Lab 10/27/11 0434  CKTOTAL 198  CKMB 2.9  CKMBINDEX --  TROPONINI 0.63*   BNP:  Lab 10/28/11 0500  POCBNP 619.7*   CBG:  Lab 10/31/11 1215 10/31/11 0825 10/30/11 2147 10/30/11 1739 10/30/11 1138 10/30/11 0759  GLUCAP 141* 201* 119* 98 95 91   Coagulation:  Lab 10/27/11 1050  LABPROT 19.7*  INR 1.64*    Urinalysis: Color, Urine             YELLOW              YELLOW   Appearance          CLEAR               CLEAR   Specific Gravity   1.014               1.005-1.030   pH               6.0                 5.0-8.0   Urine Glucose          NEGATIVE           NEG    mg/dL   Bilirubin           NEGATIVE           NEG   Ketones                 NEGATIVE           NEG   mg/dL   Blood                 NEGATIVE           NEG   Protein           30             NEG   mg/dL   Urobilinogen          0.2                 0.0-1.0 mg/dL   Nitrite                                   NEGATIVE           NEG   Leukocytes                                NEGATIVE           NEG  WBC / HPF                                 0-2                 <3     WBC/hpf   Micro Results: Recent Results (from the past 240 hour(s))  CULTURE, BLOOD (  ROUTINE X 2)     Status: Normal (Preliminary result)   Collection Time   10/27/11  4:15 AM      Component Value Range Status Comment   Specimen Description BLOOD ARM RIGHT   Final    Special Requests     Final    Value: BOTTLES DRAWN AEROBIC AND ANAEROBIC 8CC  PT ON FLAGYL,CIPRO   Setup Time [id number]   Final    Culture     Final    Value:        BLOOD CULTURE RECEIVED NO GROWTH TO DATE CULTURE WILL BE HELD FOR 5 DAYS BEFORE ISSUING A FINAL NEGATIVE REPORT   Report Status PENDING   Incomplete   CULTURE, BLOOD (ROUTINE X 2)     Status: Normal (Preliminary result)   Collection Time   10/27/11  4:15 AM      Component Value Range Status Comment   Specimen Description BLOOD RIGHT ARM   Final    Special Requests     Final    Value: BOTTLES DRAWN AEROBIC AND ANAEROBIC 10CC EACH PATIENT ON FOLLOWING CIPRO,FLAGYL   Setup Time [id number]   Final    Culture     Final    Value:        BLOOD CULTURE RECEIVED NO GROWTH TO DATE CULTURE WILL BE HELD FOR 5 DAYS BEFORE ISSUING A FINAL NEGATIVE REPORT   Report Status PENDING   Incomplete   URINE CULTURE     Status: Normal   Collection Time   10/27/11  6:20 AM      Component Value Range Status  Comment   Specimen Description URINE, RANDOM   Final    Special Requests NONE   Final    Setup Time [id number]   Final    Colony Count NO GROWTH   Final    Culture NO GROWTH   Final    Report Status 10/28/2011 FINAL   Final   MRSA PCR SCREENING     Status: Normal   Collection Time   10/27/11  9:42 AM      Component Value Range Status Comment   MRSA by PCR NEGATIVE  NEGATIVE  Final   AFB CULTURE WITH SMEAR     Status: Normal (Preliminary result)   Collection Time   10/27/11  3:22 PM      Component Value Range Status Comment   Specimen Description BRONCHIAL WASHINGS   Final    Special Requests NONE   Final    ACID FAST SMEAR NO ACID FAST BACILLI SEEN   Final    Culture     Final    Value: CULTURE WILL BE EXAMINED FOR 6 WEEKS BEFORE ISSUING A FINAL REPORT   Report Status PENDING   Incomplete   PNEUMOCYSTIS JIROVECI SMEAR BY DFA     Status: Normal   Collection Time   10/27/11  3:22 PM      Component Value Range Status Comment   Specimen Source-PJSRC BRONCHIAL WASHINGS   Final    Pneumocystis jiroveci Ag POSITIVE   Final   FUNGUS CULTURE W SMEAR     Status: Normal (Preliminary result)   Collection Time   10/27/11  3:22 PM      Component Value Range Status Comment   Specimen Description BRONCHIAL WASHINGS   Final    Special Requests ADDED ON [id number] @1649    Final    Fungal Smear NO YEAST OR FUNGAL ELEMENTS SEEN   Final    Culture CULTURE  IN PROGRESS FOR FOUR WEEKS   Final    Report Status PENDING   Incomplete   CULTURE, RESPIRATORY     Status: Normal   Collection Time   10/27/11  3:22 PM      Component Value Range Status Comment   Specimen Description BRONCHIAL WASHINGS   Final    Special Requests ADDED ON [id number] @1658    Final    Gram Stain     Final    Value: RARE WBC PRESENT, PREDOMINANTLY PMN     NO SQUAMOUS EPITHELIAL CELLS SEEN     NO ORGANISMS SEEN   Culture NO GROWTH 2 DAYS   Final    Report Status 10/29/2011 FINAL   Final   AFB CULTURE, BLOOD     Status: Normal  (Preliminary result)   Collection Time   10/28/11  5:15 AM      Component Value Range Status Comment   Specimen Description BLOOD LEFT   Final    Special Requests CVC IBV   Final    Culture     Final    Value: CULTURE WILL BE EXAMINED FOR 6 WEEKS BEFORE ISSUING A FINAL REPORT   Report Status PENDING   Incomplete   CRYPTOSPORIDIUM SMEAR, FECAL     Status: Normal   Collection Time   10/29/11  1:10 PM      Component Value Range Status Comment   Specimen Description STOOL   Final    Special Requests NONE   Final    Cryptosporidium Smear.     Final    Value: NO CRYPTOSPORIDIUM SEEN NO ISOSPORA SEEN NO CYCLOSPORA SEEN   Report Status 10/31/2011 FINAL   Final   STOOL CULTURE     Status: Normal (Preliminary result)   Collection Time   10/29/11  1:10 PM      Component Value Range Status Comment   Specimen Description STOOL   Final    Special Requests NONE   Final    Culture     Final    Value: NO SUSPICIOUS COLONIES, CONTINUING TO HOLD     Note: REDUCED NORMAL FLORA PRESENT   Report Status PENDING   Incomplete    Studies/Results: MICRO:  Blood Culture  HCV negative  HBsAg negative  HBsAb negative  HBcAb negative  PJP positive  AFB on bronchial wash is negative       Medications: I have reviewed the patient's current medications. Scheduled Meds:    . azithromycin  1,200 mg Oral Weekly  . heparin  5,000 Units Subcutaneous Q8H  . insulin aspart  0-15 Units Subcutaneous TID WC  . pantoprazole  40 mg Oral Q1200  . predniSONE  40 mg Oral BID  . sulfamethoxazole-trimethoprim  2 tablet Oral Q8H  . DISCONTD: sodium chloride   Intravenous Once  . DISCONTD: antiseptic oral rinse  15 mL Mouth Rinse QID  . DISCONTD: chlorhexidine  15 mL Mouth Rinse BID  . DISCONTD: folic acid  1 mg Intravenous Daily  . DISCONTD: sulfamethoxazole-trimethoprim  320 mg Intravenous Q8H  . DISCONTD: thiamine  100 mg Intravenous Daily   Continuous Infusions:  PRN Meds:.sodium chloride, DISCONTD:  ondansetron (ZOFRAN) IV    Assessment/Plan:    1) PCP pneumonia: In general the patient is doing well. His breathing is unlabored and his O2 sat is stable. He still requires 4 L of oxygen by nasal cannula and gets dyspneic when walking. Blood pressures are running slightly low, but he is asymptomatic. continue current therapy per  ID recommendations.  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 Pt reports 2-3 sexual encounters since arriving in [place]; denies IV drug use -- Still awaiting viral load and genotyping -- ID will address initiating antiretroviral therapy; given undocumented status, we are applying for emergency Medicaid.t his ability to receive and pay for therapy -- follow ID recommendations for initiation of therapy -- MAC prophylaxis - Indicated in pts with CD4<50. Continue azithromycin (ZITHROMAX) tablet 1,200 mg, PO weekly until CD4 >100  for more than three months  3) Nausea - continue ondansetron (ZOFRAN) IV 4 mg, IV, Q6H PRN for nausea  4 Diarrhea - Pt reports and episode of diarrhea on evening of 10/29/11. He has not had a BM since.  Giardia and cryptosporidium screen were both negative. Will check c diff even though has not had much healthcare exposure in the past months.   5) malnutrition: Given report of >10 lb weight loss in the last month, and having been intubated in the ICU we will order a nutrition consult  6) history of heavy EtOH use: While he is now HD4 and the risk of withdrawal is low, we will start CIWA, to be on the conservative side.    LOS: 4 days   Nylani Michetti 10/31/2011, 1:40 PM

## 2011-10-31 NOTE — Progress Notes (Signed)
Clinical Social Work-CSW contacted interpreter to inquire about providing translation services to [facility] representative while pt in hospital. Interpreter is  able to serve as Nurse, learning disability for [facility] intake process on Monday and to assist with ADAP process. CSW left message for [facility] worker and will continue to facilitation coordination of services as needed. Jodean Lima, [phone]

## 2011-11-01 DIAGNOSIS — B20 Human immunodeficiency virus [HIV] disease: Secondary | ICD-10-CM

## 2011-11-01 DIAGNOSIS — B59 Pneumocystosis: Secondary | ICD-10-CM

## 2011-11-01 LAB — COMPREHENSIVE METABOLIC PANEL
ALT: 83 U/L — ABNORMAL HIGH (ref 0–53)
Albumin: 1.8 g/dL — ABNORMAL LOW (ref 3.5–5.2)
Alkaline Phosphatase: 102 U/L (ref 39–117)
BUN: 13 mg/dL (ref 6–23)
Chloride: 97 mEq/L (ref 96–112)
Glucose, Bld: 111 mg/dL — ABNORMAL HIGH (ref 70–99)
Potassium: 4.6 mEq/L (ref 3.5–5.1)
Total Bilirubin: 0.1 mg/dL — ABNORMAL LOW (ref 0.3–1.2)

## 2011-11-01 LAB — DIFFERENTIAL
Basophils Absolute: 0 10*3/uL (ref 0.0–0.1)
Basophils Relative: 0 % (ref 0–1)
Eosinophils Absolute: 0 10*3/uL (ref 0.0–0.7)
Monocytes Relative: 5 % (ref 3–12)
Neutro Abs: 5.3 10*3/uL (ref 1.7–7.7)
Neutrophils Relative %: 91 % — ABNORMAL HIGH (ref 43–77)

## 2011-11-01 LAB — BENZODIAZEPINE, QUANTITATIVE, URINE
Alprazolam (GC/LC/MS), ur confirm: NEGATIVE NG/ML
Lorazepam UR QT: NEGATIVE NG/ML
Nordiazepam GC/MS Conf: NEGATIVE NG/ML
Oxazepam GC/MS Conf: NEGATIVE NG/ML

## 2011-11-01 LAB — HEMOGLOBIN A1C
Hgb A1c MFr Bld: 5.9 % — ABNORMAL HIGH (ref <5.7)
Mean Plasma Glucose: 123 mg/dL — ABNORMAL HIGH (ref <117)

## 2011-11-01 LAB — GLUCOSE, CAPILLARY
Glucose-Capillary: 146 mg/dL — ABNORMAL HIGH (ref 70–99)
Glucose-Capillary: 173 mg/dL — ABNORMAL HIGH (ref 70–99)

## 2011-11-01 LAB — CBC
MCH: 30.5 pg (ref 26.0–34.0)
MCHC: 34.1 g/dL (ref 30.0–36.0)
RDW: 13.7 % (ref 11.5–15.5)

## 2011-11-01 NOTE — Progress Notes (Addendum)
Subjective:  Pt is a 40 yo Spanish speaking man on hospital day 5 originally presenting in acute respiratory failure secondary to PCP pneumonia.  Pt found to be HIV+ (CD4<10).  O/N events:  CIWA of 9 at 16:01 on 10/31/11.  CIWA protocol for Ativan ordered.  CIWA of 0  for the remainder of night. Pt doing well otherwise. Pt says his nose feels dry, and he has some pressure in his ears. Pt has not had a BM since 11/7.  Pt denies fever, chest pain, abdominal pain, changes in urinary or bowel habits. Encounter took place using interpretor line.   Objective: Vital signs in last 24 hours: Filed Vitals:   10/31/11 0630 10/31/11 1430 10/31/11 2120 11/01/11 0645  BP: 100/58 104/62 103/70 104/68  Pulse: 86 88 82 74  Temp: 96.6 F (35.9 C) 97 F (36.1 C) 96.5 F (35.8 C) 96.5 F (35.8 C)  TempSrc: Oral Oral Oral Oral  Resp: 22 22 20 24   Weight: 122 lb 9.2 oz (55.6 kg)   118 lb 9.7 oz (53.8 kg)  SpO2: 98% 96% 98% 100%   Weight change: -3 lb 15.5 oz (-1.8 kg)  Intake/Output Summary (Last 24 hours) at 11/01/11 0947 Last data filed at 11/01/11 0645  Gross per 24 hour  Intake    300 ml  Output   3105 ml  Net  -2805 ml   Physical Exam: Vitals reviewed. General: resting in bed, NAD Cardiac: RRR, no rubs, murmurs or gallops appreciated Pulm: shallow depth of breathing, mild bilateral ronchi - improved from yesterday Neuro: alert and oriented X3, cranial nerves II-XII grossly intact Lab Results: Basic Metabolic Panel:  Lab 11/01/11 1610 10/31/11 0555 10/29/11 0330  NA 132* 134* --  K 4.6 4.3 --  CL 97 102 --  CO2 24 22 --  GLUCOSE 111* 152* --  BUN 13 11 --  CREATININE 0.46* 0.52 --  CALCIUM 9.1 8.4 --  MG -- -- 2.3  PHOS -- -- 2.9   Liver Function Tests:  Lab 11/01/11 0630 10/28/11 0500  AST 105* 40*  ALT 83* 13  ALKPHOS 102 49  BILITOT 0.1* 0.1*  PROT 7.9 5.9*  ALBUMIN 1.8* 1.0*   CBC:  Lab 11/01/11 0630 10/31/11 0555 10/27/11 1050  WBC 5.8 5.0 --  NEUTROABS 5.3 -- 5.6    HGB 12.0* 10.8* --  HCT 35.2* 31.5* --  MCV 89.6 88.7 --  PLT 355 299 --   Cardiac Enzymes:  Lab 10/27/11 0434  CKTOTAL 198  CKMB 2.9  CKMBINDEX --  TROPONINI 0.63*   BNP:  Lab 10/28/11 0500  POCBNP 619.7*   CBG:  Lab 10/31/11 2125 10/31/11 1747 10/31/11 1215 10/31/11 0825 10/30/11 2147 10/30/11 1739  GLUCAP 156* 173* 141* 201* 119* 98   Coagulation:  Lab 10/27/11 1050  LABPROT 19.7*  INR 1.64*   Micro Results: Recent Results (from the past 240 hour(s))  CULTURE, BLOOD (ROUTINE X 2)     Status: Normal (Preliminary result)   Collection Time   10/27/11  4:15 AM      Component Value Range Status Comment   Specimen Description BLOOD ARM RIGHT   Final    Special Requests     Final    Value: BOTTLES DRAWN AEROBIC AND ANAEROBIC 8CC  PT ON FLAGYL,CIPRO   Setup Time [id number]   Final    Culture     Final    Value: GRAM POSITIVE RODS     Note: Gram Stain Report Called to,Read  Back By and Verified With: DAWN RILEY 10/31/2011 @ 2205 HAJAM   Report Status PENDING   Incomplete   CULTURE, BLOOD (ROUTINE X 2)     Status: Normal (Preliminary result)   Collection Time   10/27/11  4:15 AM      Component Value Range Status Comment   Specimen Description BLOOD RIGHT ARM   Final    Special Requests     Final    Value: BOTTLES DRAWN AEROBIC AND ANAEROBIC 10CC EACH PATIENT ON FOLLOWING CIPRO,FLAGYL   Setup Time [id number]   Final    Culture     Final    Value:        BLOOD CULTURE RECEIVED NO GROWTH TO DATE CULTURE WILL BE HELD FOR 5 DAYS BEFORE ISSUING A FINAL NEGATIVE REPORT   Report Status PENDING   Incomplete   URINE CULTURE     Status: Normal   Collection Time   10/27/11  6:20 AM      Component Value Range Status Comment   Specimen Description URINE, RANDOM   Final    Special Requests NONE   Final    Setup Time [id number]   Final    Colony Count NO GROWTH   Final    Culture NO GROWTH   Final    Report Status 10/28/2011 FINAL   Final   MRSA PCR SCREENING     Status:  Normal   Collection Time   10/27/11  9:42 AM      Component Value Range Status Comment   MRSA by PCR NEGATIVE  NEGATIVE  Final   AFB CULTURE WITH SMEAR     Status: Normal (Preliminary result)   Collection Time   10/27/11  3:22 PM      Component Value Range Status Comment   Specimen Description BRONCHIAL WASHINGS   Final    Special Requests NONE   Final    ACID FAST SMEAR NO ACID FAST BACILLI SEEN   Final    Culture     Final    Value: CULTURE WILL BE EXAMINED FOR 6 WEEKS BEFORE ISSUING A FINAL REPORT   Report Status PENDING   Incomplete   PNEUMOCYSTIS JIROVECI SMEAR BY DFA     Status: Normal   Collection Time   10/27/11  3:22 PM      Component Value Range Status Comment   Specimen Source-PJ[facility] BRONCHIAL WASHINGS   Final    Pneumocystis jiroveci Ag POSITIVE   Final   FUNGUS CULTURE W SMEAR     Status: Normal (Preliminary result)   Collection Time   10/27/11  3:22 PM      Component Value Range Status Comment   Specimen Description BRONCHIAL WASHINGS   Final    Special Requests ADDED ON [id number] @1649    Final    Fungal Smear NO YEAST OR FUNGAL ELEMENTS SEEN   Final    Culture CULTURE IN PROGRESS FOR FOUR WEEKS   Final    Report Status PENDING   Incomplete   CULTURE, RESPIRATORY     Status: Normal   Collection Time   10/27/11  3:22 PM      Component Value Range Status Comment   Specimen Description BRONCHIAL WASHINGS   Final    Special Requests ADDED ON [id number] @1658    Final    Gram Stain     Final    Value: RARE WBC PRESENT, PREDOMINANTLY PMN     NO SQUAMOUS EPITHELIAL CELLS SEEN     NO ORGANISMS  SEEN   Culture NO GROWTH 2 DAYS   Final    Report Status 10/29/2011 FINAL   Final   AFB CULTURE, BLOOD     Status: Normal (Preliminary result)   Collection Time   10/28/11  5:15 AM      Component Value Range Status Comment   Specimen Description BLOOD LEFT   Final    Special Requests CVC IBV   Final    Culture     Final    Value: CULTURE WILL BE EXAMINED FOR 6 WEEKS BEFORE ISSUING A  FINAL REPORT   Report Status PENDING   Incomplete   CRYPTOSPORIDIUM SMEAR, FECAL     Status: Normal   Collection Time   10/29/11  1:10 PM      Component Value Range Status Comment   Specimen Description STOOL   Final    Special Requests NONE   Final    Cryptosporidium Smear.     Final    Value: NO CRYPTOSPORIDIUM SEEN NO ISOSPORA SEEN NO CYCLOSPORA SEEN   Report Status 10/31/2011 FINAL   Final   STOOL CULTURE     Status: Normal (Preliminary result)   Collection Time   10/29/11  1:10 PM      Component Value Range Status Comment   Specimen Description STOOL   Final    Special Requests NONE   Final    Culture     Final    Value: NO SUSPICIOUS COLONIES, CONTINUING TO HOLD     Note: REDUCED NORMAL FLORA PRESENT   Report Status PENDING   Incomplete    Medications: I have reviewed the patient's current medications. Scheduled Meds:   . azithromycin  1,200 mg Oral Weekly  . feeding supplement  1 Container Oral TID BM  . folic acid  1 mg Oral Daily  . heparin  5,000 Units Subcutaneous Q8H  . insulin aspart  0-15 Units Subcutaneous TID WC  . multivitamins ther. w/minerals  1 tablet Oral Daily  . pantoprazole  40 mg Oral Q1200  . predniSONE  40 mg Oral BID  . sulfamethoxazole-trimethoprim  2 tablet Oral Q8H  . thiamine  100 mg Oral Daily   Or  . thiamine  100 mg Intravenous Daily   Continuous Infusions:  PRN Meds:.sodium chloride, LORazepam, LORazepam  Assessment/Plan: 1) PCP pneumonia: In general the patient is doing well. His breathing is unlabored and his O2 sat is stable. He still requires 4 L of oxygen by nasal cannula and gets dyspneic when walking. Blood pressures are running slightly low, but he is asymptomatic. continue current therapy per ID recommendations.  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 Pt reports 2-3 sexual encounters since arriving in [place]; denies IV drug use  -- Still awaiting viral load and genotyping  -- ID will address initiating antiretroviral therapy; given undocumented status, we are applying for emergency Medicaid to help his ability to receive and pay for therapy  --  follow ID recommendations for initiation of therapy  -- MAC prophylaxis - Indicated in pts with CD4<50. Continue azithromycin (ZITHROMAX) tablet 1,200 mg, PO weekly until CD4 >100 for more than three months   3) Nausea - continue ondansetron (ZOFRAN) IV 4 mg, IV, Q6H PRN for nausea   4 Diarrhea - Pt reports and episode of diarrhea on evening of 10/29/11. He has not had a BM since. Giardia and cryptosporidium screen were both negative.  -- follow-up on C.diff (ordered 10/31/11)  5) malnutrition: Given report of >10 lb weight loss in the last month, and having been intubated in the ICU we will order a nutrition consult   6) history of heavy EtOH use: Pt had CIWA of 9 at 1601 on 10/31/11 - has be 0 since. Continue to monitor    LOS: 5 days   [place] 11/01/2011, 9:47 AM

## 2011-11-01 NOTE — Progress Notes (Signed)
Infectious disease followup: Mr. 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

## 2011-11-02 LAB — GLUCOSE, CAPILLARY
Glucose-Capillary: 104 mg/dL — ABNORMAL HIGH (ref 70–99)
Glucose-Capillary: 176 mg/dL — ABNORMAL HIGH (ref 70–99)
Glucose-Capillary: 139 mg/dL — ABNORMAL HIGH (ref 70–99)

## 2011-11-02 LAB — STOOL CULTURE

## 2011-11-02 LAB — CLOSTRIDIUM DIFFICILE BY PCR: Toxigenic C. Difficile by PCR: NEGATIVE

## 2011-11-02 MED ORDER — WHITE PETROLATUM GEL
Status: AC
  Filled 2011-11-02: qty 5

## 2011-11-02 NOTE — Progress Notes (Signed)
ANTIBIOTIC CONSULT NOTE - FOLLOW UP  Pharmacy Consult for Bactrim Indication: PCP Pneumonia  No Known Allergies  Patient Measurements: Weight: 118 lb 9.7 oz (53.8 kg) Adjusted Body Weight:   Vital Signs: Temp: 96.6 F (35.9 C) (11/11 0625) Temp src: Oral (11/11 0625) BP: 109/71 mmHg (11/11 0625) Pulse Rate: 87  (11/11 0625) Intake/Output from previous day: 11/10 0701 - 11/11 0700 In: 360 [P.O.:360] Out: 2276 [Urine:2275; Stool:1] Intake/Output from this shift: Total I/O In: -  Out: 550 [Urine:550]  Labs:  [facility] 11/01/11 0630 10/31/11 0555  WBC 5.8 5.0  HGB 12.0* 10.8*  PLT 355 299  LABCREA -- --  CREATININE 0.46* 0.52   CrCl is unknown because there is no height on file for the current visit. No results found for this basename: VANCOTROUGH:2,VANCOPEAK:2,VANCORANDOM:2,GENTTROUGH:2,GENTPEAK:2,GENTRANDOM:2,TOBRATROUGH:2,TOBRAPEAK:2,TOBRARND:2,AMIKACINPEAK:2,AMIKACINTROU:2,AMIKACIN:2, in the last 72 hours   Microbiology: Recent Results (from the past 720 hour(s))  CULTURE, BLOOD (ROUTINE X 2)     Status: Normal (Preliminary result)   Collection Time   10/27/11  4:15 AM      Component Value Range Status Comment   Specimen Description BLOOD ARM RIGHT   Final    Special Requests     Final    Value: BOTTLES DRAWN AEROBIC AND ANAEROBIC 8CC  PT ON FLAGYL,CIPRO   Setup Time [id number]   Final    Culture     Final    Value: GRAM POSITIVE RODS     Note: Gram Stain Report Called to,Read Back By and Verified With: DAWN RILEY 10/31/2011 @ 2205 HAJAM   Report Status PENDING   Incomplete   CULTURE, BLOOD (ROUTINE X 2)     Status: Normal   Collection Time   10/27/11  4:15 AM      Component Value Range Status Comment   Specimen Description BLOOD RIGHT ARM   Final    Special Requests     Final    Value: BOTTLES DRAWN AEROBIC AND ANAEROBIC 10CC EACH PATIENT ON FOLLOWING CIPRO,FLAGYL   Setup Time [id number]   Final    Culture NO GROWTH 5 DAYS   Final    Report Status  11/02/2011 FINAL   Final   URINE CULTURE     Status: Normal   Collection Time   10/27/11  6:20 AM      Component Value Range Status Comment   Specimen Description URINE, RANDOM   Final    Special Requests NONE   Final    Setup Time [id number]   Final    Colony Count NO GROWTH   Final    Culture NO GROWTH   Final    Report Status 10/28/2011 FINAL   Final   MRSA PCR SCREENING     Status: Normal   Collection Time   10/27/11  9:42 AM      Component Value Range Status Comment   MRSA by PCR NEGATIVE  NEGATIVE  Final   AFB CULTURE WITH SMEAR     Status: Normal (Preliminary result)   Collection Time   10/27/11  3:22 PM      Component Value Range Status Comment   Specimen Description BRONCHIAL WASHINGS   Final    Special Requests NONE   Final    ACID FAST SMEAR NO ACID FAST BACILLI SEEN   Final    Culture     Final    Value: CULTURE WILL BE EXAMINED FOR 6 WEEKS BEFORE ISSUING A FINAL REPORT   Report Status PENDING   Incomplete   PNEUMOCYSTIS  JIROVECI SMEAR BY DFA     Status: Normal   Collection Time   10/27/11  3:22 PM      Component Value Range Status Comment   Specimen Source-[facility] BRONCHIAL WASHINGS   Final    Pneumocystis jiroveci Ag POSITIVE   Final   FUNGUS CULTURE W SMEAR     Status: Normal (Preliminary result)   Collection Time   10/27/11  3:22 PM      Component Value Range Status Comment   Specimen Description BRONCHIAL WASHINGS   Final    Special Requests ADDED ON [id number] @1649    Final    Fungal Smear NO YEAST OR FUNGAL ELEMENTS SEEN   Final    Culture CULTURE IN PROGRESS FOR FOUR WEEKS   Final    Report Status PENDING   Incomplete   CULTURE, RESPIRATORY     Status: Normal   Collection Time   10/27/11  3:22 PM      Component Value Range Status Comment   Specimen Description BRONCHIAL WASHINGS   Final    Special Requests ADDED ON [id number] @1658    Final    Gram Stain     Final    Value: RARE WBC PRESENT, PREDOMINANTLY PMN     NO SQUAMOUS EPITHELIAL CELLS SEEN     NO  ORGANISMS SEEN   Culture NO GROWTH 2 DAYS   Final    Report Status 10/29/2011 FINAL   Final   AFB CULTURE, BLOOD     Status: Normal (Preliminary result)   Collection Time   10/28/11  5:15 AM      Component Value Range Status Comment   Specimen Description BLOOD LEFT   Final    Special Requests CVC IBV   Final    Culture     Final    Value: CULTURE WILL BE EXAMINED FOR 6 WEEKS BEFORE ISSUING A FINAL REPORT   Report Status PENDING   Incomplete   CRYPTOSPORIDIUM SMEAR, FECAL     Status: Normal   Collection Time   10/29/11  1:10 PM      Component Value Range Status Comment   Specimen Description STOOL   Final    Special Requests NONE   Final    Cryptosporidium Smear.     Final    Value: NO CRYPTOSPORIDIUM SEEN NO ISOSPORA SEEN NO CYCLOSPORA SEEN   Report Status 10/31/2011 FINAL   Final   STOOL CULTURE     Status: Normal (Preliminary result)   Collection Time   10/29/11  1:10 PM      Component Value Range Status Comment   Specimen Description STOOL   Final    Special Requests NONE   Final    Culture     Final    Value: NO SUSPICIOUS COLONIES, CONTINUING TO HOLD     Note: REDUCED NORMAL FLORA PRESENT   Report Status PENDING   Incomplete   CLOSTRIDIUM DIFFICILE BY PCR     Status: Normal   Collection Time   11/02/11 12:55 AM      Component Value Range Status Comment   C difficile by pcr NEGATIVE  NEGATIVE  Final     Anti-infectives     Start     Dose/Rate Route Frequency Ordered Stop   10/31/11 0000   azithromycin (ZITHROMAX) tablet 1,200 mg        1,200 mg Oral Weekly 10/30/11 1534     10/30/11 2200  sulfamethoxazole-trimethoprim (BACTRIM DS) 800-160 MG per tablet 2 tablet  2 tablet Oral 3 times per day 10/30/11 1534     10/27/11 1300   vancomycin (VANCOCIN) IVPB 1000 mg/200 mL premix  Status:  Discontinued        1,000 mg 200 mL/hr over 60 Minutes Intravenous Every 8 hours 10/27/11 1028 10/29/11 1051   10/27/11 1300   piperacillin-tazobactam (ZOSYN) IVPB 3.375 g  Status:   Discontinued        3.375 g 100 mL/hr over 30 Minutes Intravenous 3 times per day 10/27/11 1028 10/29/11 1051   10/27/11 1100   sulfamethoxazole-trimethoprim (BACTRIM) 320 mg in dextrose 5 % 500 mL IVPB  Status:  Discontinued        320 mg 346.7 mL/hr over 90 Minutes Intravenous 3 times per day 10/27/11 1028 10/30/11 1534   10/27/11 0900   Levofloxacin (LEVAQUIN) IVPB 750 mg  Status:  Discontinued     Comments: STAT!!!!!!!!!!!      750 mg 100 mL/hr over 90 Minutes Intravenous Every 24 hours 10/27/11 0857 10/28/11 1144   10/27/11 0900   sulfamethoxazole-trimethoprim (BACTRIM) 5 mg/kg/day in dextrose 5 % 500 mL IVPB  Status:  Discontinued     Comments: STAT for PCOP empiric pharamcy dose all now please including load      5 mg/kg/day 333.3 mL/hr over 90 Minutes Intravenous 3 times per day 10/27/11 0857 10/27/11 1029   10/27/11 0500   vancomycin (VANCOCIN) IVPB 1000 mg/200 mL premix        1,000 mg 200 mL/hr over 60 Minutes Intravenous  Once 10/27/11 0454 10/27/11 0649   10/27/11 0500   piperacillin-tazobactam (ZOSYN) IVPB 3.375 g        3.375 g 12.5 mL/hr over 240 Minutes Intravenous  Once 10/27/11 0454 10/27/11 0914          Assessment: 40yom on Bactrim Day 7 of 21 for treatment of PCP pneumonia. Pt is newly diagnosed with HIV and continues to be afebrile. Current PCP treatment regimen: Bactrim DS 2 tabs three times daily - regimen appropriate. Pt is also receiving Azithromycin 1200mg  weekly for MAC prophylaxis. ID is consulted and managing HIV regimen initiation. - SCr 0.46; CrCl >152ml/min - K+ 4.6  Goal of Therapy:  Clinical improvement  Plan:  1. Continue Bactrim DS 2 tabs po TID for total of 21 days 2. Monitor renal function and potassium levels while on Bactrim  Cleon Dew 11/02/2011,2:59 PM

## 2011-11-02 NOTE — Progress Notes (Signed)
Pt. 97% on 3L [place] while ambulating. Weaned to 2L [place]. Will continue to wean as tolerated.

## 2011-11-02 NOTE — Progress Notes (Signed)
Subjective:  40 yo old Spanish speaking man with minimal recorded PMH presented to ED 10/27/11 with fever, shortness of breath, cough and fatigue. 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 Pt is s/p intubation and vasopressors. Pt weaned off pressors and extubated on 10/29/11 and has been doing well since then.  Pt reports no acute events overnight. Pt desat to 90% on 4L and was increased to 5L nasal canula. Pt says his dyspnea has improved a great deal.  He is still coughing occasionally - bringing up yellow sputum.  Pt had a normal BM last night. Pt has only been up and out of bed to go to the bathroom but does not report increased dyspnea with movement.  Pt complained of dry nose and ear fullness yesterday, but both have improved today.  Pt denies chest pain, HA, dizziness, lightheadedness, sore throat, nausea, vomiting, abdominal pain, dysuria or other urinary symptoms, rash.   Objective: Vital signs in last 24 hours: Filed Vitals:   11/01/11 0645 11/01/11 1506 11/01/11 2124 11/02/11 0625  BP: 104/68 104/68 107/72 109/71  Pulse: 74 93 96 87  Temp: 96.5 F (35.8 C) 96.6 F (35.9 C) 97 F (36.1 C) 96.6 F (35.9 C)  TempSrc: Oral Oral Oral Oral  Resp: 24 20 20 20   Weight: 118 lb 9.7 oz (53.8 kg)     SpO2: 100% 95% 96% 99%   Weight change:   Intake/Output Summary (Last 24 hours) at 11/02/11 1024 Last data filed at 11/02/11 0600  Gross per 24 hour  Intake    360 ml  Output   2276 ml  Net  -1916 ml   Physical Exam: Vitals reviewed. O2 sat 100% on 5L nasal canula General: resting in bed, NAD HEENT: PERRL, EOMI, no scleral icterus Cardiac: RRR, no rubs, murmurs or gallops Pulm: shallow depth of breathing, scattered ronchi throughout - improved from 11/01/11 Abd: soft, nontender, nondistended, BS present Ext: warm and well perfused, no pedal edema Neuro: alert and oriented X3, cranial nerves II-XII grossly intact,  Lab Results: Basic Metabolic  Panel:  Lab 11/01/11 0630 10/31/11 0555 10/29/11 0330  NA 132* 134* --  K 4.6 4.3 --  CL 97 102 --  CO2 24 22 --  GLUCOSE 111* 152* --  BUN 13 11 --  CREATININE 0.46* 0.52 --  CALCIUM 9.1 8.4 --  MG -- -- 2.3  PHOS -- -- 2.9   Liver Function Tests:  Lab 11/01/11 0630 10/28/11 0500  AST 105* 40*  ALT 83* 13  ALKPHOS 102 49  BILITOT 0.1* 0.1*  PROT 7.9 5.9*  ALBUMIN 1.8* 1.0*   CBC:  Lab 11/01/11 0630 10/31/11 0555 10/27/11 1050  WBC 5.8 5.0 --  NEUTROABS 5.3 -- 5.6  HGB 12.0* 10.8* --  HCT 35.2* 31.5* --  MCV 89.6 88.7 --  PLT 355 299 --   CBG:  Lab 11/02/11 0813 11/01/11 2127 11/01/11 1737 11/01/11 1309 11/01/11 0842 10/31/11 2125  GLUCAP 104* 173* 144* 146* 132* 156*   Hemoglobin A1C:  Lab 11/01/11 0630  HGBA1C 5.9*   Micro Results: Recent Results (from the past 240 hour(s))  CULTURE, BLOOD (ROUTINE X 2)     Status: Normal (Preliminary result)   Collection Time   10/27/11  4:15 AM      Component Value Range Status Comment   Specimen Description BLOOD ARM RIGHT   Final    Special Requests     Final    Value:  BOTTLES DRAWN AEROBIC AND ANAEROBIC 8CC  PT ON FLAGYL,CIPRO   Setup Time [id number]   Final    Culture     Final    Value: GRAM POSITIVE RODS     Note: Gram Stain Report Called to,Read Back By and Verified With: DAWN RILEY 10/31/2011 @ 2205 HAJAM   Report Status PENDING   Incomplete   CULTURE, BLOOD (ROUTINE X 2)     Status: Normal   Collection Time   10/27/11  4:15 AM      Component Value Range Status Comment   Specimen Description BLOOD RIGHT ARM   Final    Special Requests     Final    Value: BOTTLES DRAWN AEROBIC AND ANAEROBIC 10CC EACH PATIENT ON FOLLOWING CIPRO,FLAGYL   Setup Time [id number]   Final    Culture NO GROWTH 5 DAYS   Final    Report Status 11/02/2011 FINAL   Final   URINE CULTURE     Status: Normal   Collection Time   10/27/11  6:20 AM      Component Value Range Status Comment   Specimen Description URINE, RANDOM   Final      Special Requests NONE   Final    Setup Time [id number]   Final    Colony Count NO GROWTH   Final    Culture NO GROWTH   Final    Report Status 10/28/2011 FINAL   Final   MRSA PCR SCREENING     Status: Normal   Collection Time   10/27/11  9:42 AM      Component Value Range Status Comment   MRSA by PCR NEGATIVE  NEGATIVE  Final   AFB CULTURE WITH SMEAR     Status: Normal (Preliminary result)   Collection Time   10/27/11  3:22 PM      Component Value Range Status Comment   Specimen Description BRONCHIAL WASHINGS   Final    Special Requests NONE   Final    ACID FAST SMEAR NO ACID FAST BACILLI SEEN   Final    Culture     Final    Value: CULTURE WILL BE EXAMINED FOR 6 WEEKS BEFORE ISSUING A FINAL REPORT   Report Status PENDING   Incomplete   PNEUMOCYSTIS JIROVECI SMEAR BY DFA     Status: Normal   Collection Time   10/27/11  3:22 PM      Component Value Range Status Comment   Specimen Source-PJSRC BRONCHIAL WASHINGS   Final    Pneumocystis jiroveci Ag POSITIVE   Final   FUNGUS CULTURE W SMEAR     Status: Normal (Preliminary result)   Collection Time   10/27/11  3:22 PM      Component Value Range Status Comment   Specimen Description BRONCHIAL WASHINGS   Final    Special Requests ADDED ON [id number] @1649    Final    Fungal Smear NO YEAST OR FUNGAL ELEMENTS SEEN   Final    Culture CULTURE IN PROGRESS FOR FOUR WEEKS   Final    Report Status PENDING   Incomplete   CULTURE, RESPIRATORY     Status: Normal   Collection Time   10/27/11  3:22 PM      Component Value Range Status Comment   Specimen Description BRONCHIAL WASHINGS   Final    Special Requests ADDED ON [id number] @1658    Final    Gram Stain     Final    Value: RARE WBC PRESENT, PREDOMINANTLY  PMN     NO SQUAMOUS EPITHELIAL CELLS SEEN     NO ORGANISMS SEEN   Culture NO GROWTH 2 DAYS   Final    Report Status 10/29/2011 FINAL   Final   AFB CULTURE, BLOOD     Status: Normal (Preliminary result)   Collection Time   10/28/11  5:15 AM       Component Value Range Status Comment   Specimen Description BLOOD LEFT   Final    Special Requests CVC IBV   Final    Culture     Final    Value: CULTURE WILL BE EXAMINED FOR 6 WEEKS BEFORE ISSUING A FINAL REPORT   Report Status PENDING   Incomplete   CRYPTOSPORIDIUM SMEAR, FECAL     Status: Normal   Collection Time   10/29/11  1:10 PM      Component Value Range Status Comment   Specimen Description STOOL   Final    Special Requests NONE   Final    Cryptosporidium Smear.     Final    Value: NO CRYPTOSPORIDIUM SEEN NO ISOSPORA SEEN NO CYCLOSPORA SEEN   Report Status 10/31/2011 FINAL   Final   STOOL CULTURE     Status: Normal (Preliminary result)   Collection Time   10/29/11  1:10 PM      Component Value Range Status Comment   Specimen Description STOOL   Final    Special Requests NONE   Final    Culture     Final    Value: NO SUSPICIOUS COLONIES, CONTINUING TO HOLD     Note: REDUCED NORMAL FLORA PRESENT   Report Status PENDING   Incomplete   CLOSTRIDIUM DIFFICILE BY PCR     Status: Normal   Collection Time   11/02/11 12:55 AM      Component Value Range Status Comment   C difficile by pcr NEGATIVE  NEGATIVE  Final    Medications: I have reviewed the patient's current medications. Scheduled Meds:   . azithromycin  1,200 mg Oral Weekly  . feeding supplement  1 Container Oral TID BM  . folic acid  1 mg Oral Daily  . heparin  5,000 Units Subcutaneous Q8H  . insulin aspart  0-15 Units Subcutaneous TID WC  . multivitamins ther. w/minerals  1 tablet Oral Daily  . pantoprazole  40 mg Oral Q1200  . predniSONE  40 mg Oral BID  . sulfamethoxazole-trimethoprim  2 tablet Oral Q8H  . thiamine  100 mg Oral Daily   Or  . thiamine  100 mg Intravenous Daily  . white petrolatum       Continuous Infusions:  PRN Meds:.sodium chloride, LORazepam, LORazepam Assessment/Plan: 1. PCP pneumonia - continue current therapy per ID recommendations  - sulfamethoxazole-trimethoprim (BACTRIM/SEPTRA  DS) tablet 800-160 mg 2 tablet,  PO, Q8H  (total 21 day therapy / currently DAY 7 ) - predniSONE (DELTASONE) tablet 40 mg, PO, BID - convert to 40 mg PO Daily on  11/03/11 for 5 days, then decrease to 20 mg PO daily for 11 days (total 21 day  therapy) - Continue O2 therapy titrated to >92% - pt increased to 5L O/N; sating well on 5L (99%), continue to monitor  2. Newly diagnosed HIV/AIDS (CD4< 10) : Pt had never been tested prior to this admission. Pt reports 2-3 sexual encounters since arriving in [place]; denies IV drug use  - Still awaiting viral load and genotyping  - ID will address initiating antiretroviral therapy; given undocumented  status, we are  applying for emergency Medicaid to help his ability to receive and pay for therapy   - Follow ID recommendations for initiation of therapy - MAC prophylaxis as indicated in pt with CD5<50. Continue Azithromycin (ZITHROMAX) tablet 1,200 mg, PO weekly until CD4>100 for three consecutive months  3. Diarrhea - Pt reports and episode of diarrhea on evening of 10/29/11. Pt reports normal BM last night. Diarrhea appears to have resolved.   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 Liver panel reveals elevated AST/ALT -  potentially due to pt ethanol use.  Pt placed on CIWA protocol.  Pt has CIWA of 9 at 1602 on 10/31/11 but has had score of 0 since. Continue to monitor   LOS: 6 days   Ogechi Kuehnel 11/02/2011, 10:24 AM

## 2011-11-02 NOTE — Consult Note (Signed)
Mr. Gerald Cox is afebrile and his pneumocystis pneumonia is  Resolving and he is now down to 3L of 02 supplementation. No new recommendations but when ready for D/C we will see if we can get him a supply of Atripla until he can be approved for ADAP.

## 2011-11-02 NOTE — Plan of Care (Signed)
Problem: Phase II Progression Outcomes Goal: Wean O2 if indicated Outcome: Progressing WEANED TO 2LNC

## 2011-11-03 DIAGNOSIS — B20 Human immunodeficiency virus [HIV] disease: Secondary | ICD-10-CM

## 2011-11-03 DIAGNOSIS — B59 Pneumocystosis: Secondary | ICD-10-CM

## 2011-11-03 LAB — CBC
HCT: 35.4 % — ABNORMAL LOW (ref 39.0–52.0)
Hemoglobin: 12 g/dL — ABNORMAL LOW (ref 13.0–17.0)
MCH: 30.4 pg (ref 26.0–34.0)
MCV: 89.6 fL (ref 78.0–100.0)
RBC: 3.95 MIL/uL — ABNORMAL LOW (ref 4.22–5.81)
WBC: 7.5 10*3/uL (ref 4.0–10.5)

## 2011-11-03 LAB — GLUCOSE, CAPILLARY
Glucose-Capillary: 368 mg/dL — ABNORMAL HIGH (ref 70–99)
Glucose-Capillary: 138 mg/dL — ABNORMAL HIGH (ref 70–99)

## 2011-11-03 LAB — COMPREHENSIVE METABOLIC PANEL
AST: 83 U/L — ABNORMAL HIGH (ref 0–37)
Albumin: 1.9 g/dL — ABNORMAL LOW (ref 3.5–5.2)
Calcium: 9.2 mg/dL (ref 8.4–10.5)
Chloride: 96 mEq/L (ref 96–112)
Creatinine, Ser: 0.59 mg/dL (ref 0.50–1.35)
Total Protein: 7.7 g/dL (ref 6.0–8.3)

## 2011-11-03 LAB — CULTURE, BLOOD (ROUTINE X 2): Culture  Setup Time: 201211050846

## 2011-11-03 LAB — LIPID PANEL
HDL: 55 mg/dL (ref >39)
LDL Cholesterol: 142 mg/dL — ABNORMAL HIGH (ref 0–99)
Total CHOL/HDL Ratio: 4.8 RATIO

## 2011-11-03 MED ORDER — PREDNISONE 20 MG PO TABS
40.0000 mg | ORAL_TABLET | Freq: Every day | ORAL | Status: AC
  Administered 2011-11-03 – 2011-11-07 (×5): 40 mg via ORAL
  Filled 2011-11-03 (×6): qty 2

## 2011-11-03 MED ORDER — EFAVIRENZ-EMTRICITAB-TENOFOVIR 600-200-300 MG PO TABS
1.0000 | ORAL_TABLET | Freq: Every day | ORAL | Status: DC
Start: 2011-11-03 — End: 2011-11-07
  Administered 2011-11-03 – 2011-11-06 (×4): 1 via ORAL
  Filled 2011-11-03 (×6): qty 1

## 2011-11-03 NOTE — Progress Notes (Signed)
Internal Medicine Attending  Date: 11/03/2011  Patient name: Gerald Cox Medical record number: [id number] Date of birth: 08/03/1971 Age: 40 y.o. Gender: male  I saw and evaluated the patient. I reviewed the resident's note by Dr. Candy Sledge and I agree with the resident's findings and plans as documented in his note.

## 2011-11-03 NOTE — Progress Notes (Addendum)
Physical Therapy Treatment Patient Details Name: Gerald Cox MRN: [id number] DOB: 1971/04/16 Today's Date: 11/03/2011  PT Assessment/Plan  PT - Assessment/Plan PT Plan: Discharge plan remains appropriate Follow Up Recommendations: Home health PT Equipment Recommended: None recommended by PT PT Goals  Acute Rehab PT Goals PT Goal: Supine/Side to Sit - Progress: Met PT Transfer Goal: Sit to Stand/Stand to Sit - Progress: Met PT Transfer Goal: Bed to Chair/Chair to Bed - Progress: Met PT Goal: Ambulate - Progress: Met PT Goal: Up/Down Stairs - Progress: Met Will see pt x1 to see if heconsistently maintains the function and is ready for D/C  PT Treatment Precautions/Restrictions  Precautions Precautions: Fall Precaution Comments: Slightly impulsive Restrictions Weight Bearing Restrictions: No Mobility (including Balance) Bed Mobility Bed Mobility: No Transfers Transfers: Yes Sit to Stand: 7: Independent Ambulation/Gait Ambulation/Gait: Yes Ambulation/Gait Assistance: 7: Independent Ambulation/Gait Assistance Details (indicate cue type and reason): no cuing needed; gait challenged by velocity changes, scanning, turns, backing up, stepping over/avoiding objects (pt is now approaching his baseline) Ambulation Distance (Feet): 400 Feet (>or= to) Assistive device: None Gait Pattern: Within Functional Limits Stairs: Yes Stairs Assistance: 7: Independent Stair Management Technique: No rails;One rail Right Number of Stairs: 12   Balance Balance Assessed: Yes (no deviations with challenges to balance (see gait comments)) Exercise    End of Session PT - End of Session Activity Tolerance: Patient tolerated treatment well (noticable fatigue) General Behavior During Session: [facility] for tasks performed Cognition: [facility] for tasks performed  Sharmain Lastra, Eliseo Gum 11/03/2011, 3:19 PM 11/03/2011  Dona Ana Bing, PT [phone] (pager)

## 2011-11-03 NOTE — Progress Notes (Signed)
Clinical Social Work-CSW spoke with [facility] intake coordinator who is able to complete intake on 11/14-CSW arranged interpreter for 15:00 on 11/13 and notified [facility] coordinator of appointment time. CSW will continue to follow for psychosocial support and discuss medication assistance with CM and continue to follow for psychosocial support. Jodean Lima, [phone]

## 2011-11-03 NOTE — Progress Notes (Signed)
Subjective:  Patient is a 40 year old man newly diagnosed with HIV/AIDS who is Hospital day 7 after presenting to the ED with respiratory failure secondary to PCP pneumonia. No events o/n. Denies fever, chest pain, abdominal pain, throat pain, headache, problems with bowel or bladder function. States last bowel movement was yesterday and was normal. States did have sweats overnight. Feels shortness of breath is improving. Does get dyspneic while walking. Currently on 2 L.  Objective: Vital signs in last 24 hours: Filed Vitals:   11/02/11 0625 11/02/11 1400 11/02/11 2112 11/03/11 0604  BP: 109/71 107/77 110/69 104/69  Pulse: 87 101 101 83  Temp: 96.6 F (35.9 C) 96.8 F (36 C) 96.8 F (36 C) 97 F (36.1 C)  TempSrc: Oral Oral Oral Oral  Resp: 20  20 20   Weight:      SpO2: 99% 97% 98% 99%   Weight change:   Intake/Output Summary (Last 24 hours) at 11/03/11 1610 Last data filed at 11/03/11 0600  Gross per 24 hour  Intake    780 ml  Output   1400 ml  Net   -620 ml   Physical Exam:  General: resting in bed, NAD Cardiac: RRR, no rubs, murmurs or gallops appreciated Pulm: shallow depth of breathing, mild bilateral ronchi - improved from yesterday Abdomen: Soft, nontender nondistended positive bowel sounds. Extremities: No pedal edema. Neuro: alert and oriented X3, cranial nerves II-XII grossly intact Lab Results: Basic Metabolic Panel:  Lab 11/03/11 9604 11/01/11 0630 10/29/11 0330  NA 130* 132* --  K 4.6 4.6 --  CL 96 97 --  CO2 20 24 --  GLUCOSE 133* 111* --  BUN 16 13 --  CREATININE 0.59 0.46* --  CALCIUM 9.2 9.1 --  MG -- -- 2.3  PHOS -- -- 2.9   A1c = 5.9  Serum Osm: 284 Urine Osm: 477 Urine Na: 85  Liver Function Tests:  Lab 11/03/11 0702 11/01/11 0630  AST 83* 105*  ALT 134* 83*  ALKPHOS 112 102  BILITOT 0.2* 0.1*  PROT 7.7 7.9  ALBUMIN 1.9* 1.8*   CBC:  Lab 11/03/11 0702 11/01/11 0630 10/27/11 1050  WBC 7.5 5.8 --  NEUTROABS -- 5.3 5.6  HGB  12.0* 12.0* --  HCT 35.4* 35.2* --  MCV 89.6 89.6 --  PLT 413* 355 --   BNP:  Lab 10/28/11 0500  POCBNP 619.7*   CBG:  Lab 11/02/11 2116 11/02/11 1729 11/02/11 1301 11/02/11 0813 11/01/11 2127 11/01/11 1737  GLUCAP 139* 114* 176* 104* 173* 144*   Coagulation:  Lab 10/27/11 1050  LABPROT 19.7*  INR 1.64*   Micro Results: Recent Results (from the past 240 hour(s))  CULTURE, BLOOD (ROUTINE X 2)     Status: Normal (Preliminary result)   Collection Time   10/27/11  4:15 AM      Component Value Range Status Comment   Specimen Description BLOOD ARM RIGHT   Final    Special Requests     Final    Value: BOTTLES DRAWN AEROBIC AND ANAEROBIC 8CC  PT ON FLAGYL,CIPRO   Setup Time [id number]   Final    Culture     Final    Value: GRAM POSITIVE RODS     Note: Gram Stain Report Called to,Read Back By and Verified With: DAWN RILEY 10/31/2011 @ 2205 HAJAM   Report Status PENDING   Incomplete   CULTURE, BLOOD (ROUTINE X 2)     Status: Normal   Collection Time   10/27/11  4:15  AM      Component Value Range Status Comment   Specimen Description BLOOD RIGHT ARM   Final    Special Requests     Final    Value: BOTTLES DRAWN AEROBIC AND ANAEROBIC 10CC EACH PATIENT ON FOLLOWING CIPRO,FLAGYL   Setup Time [id number]   Final    Culture NO GROWTH 5 DAYS   Final    Report Status 11/02/2011 FINAL   Final   URINE CULTURE     Status: Normal   Collection Time   10/27/11  6:20 AM      Component Value Range Status Comment   Specimen Description URINE, RANDOM   Final    Special Requests NONE   Final    Setup Time [id number]   Final    Colony Count NO GROWTH   Final    Culture NO GROWTH   Final    Report Status 10/28/2011 FINAL   Final   MRSA PCR SCREENING     Status: Normal   Collection Time   10/27/11  9:42 AM      Component Value Range Status Comment   MRSA by PCR NEGATIVE  NEGATIVE  Final   AFB CULTURE WITH SMEAR     Status: Normal (Preliminary result)   Collection Time   10/27/11  3:22 PM       Component Value Range Status Comment   Specimen Description BRONCHIAL WASHINGS   Final    Special Requests NONE   Final    ACID FAST SMEAR NO ACID FAST BACILLI SEEN   Final    Culture     Final    Value: CULTURE WILL BE EXAMINED FOR 6 WEEKS BEFORE ISSUING A FINAL REPORT   Report Status PENDING   Incomplete   PNEUMOCYSTIS JIROVECI SMEAR BY DFA     Status: Normal   Collection Time   10/27/11  3:22 PM      Component Value Range Status Comment   Specimen Source-PJ[facility] BRONCHIAL WASHINGS   Final    Pneumocystis jiroveci Ag POSITIVE   Final   FUNGUS CULTURE W SMEAR     Status: Normal (Preliminary result)   Collection Time   10/27/11  3:22 PM      Component Value Range Status Comment   Specimen Description BRONCHIAL WASHINGS   Final    Special Requests ADDED ON [id number] @1649    Final    Fungal Smear NO YEAST OR FUNGAL ELEMENTS SEEN   Final    Culture CULTURE IN PROGRESS FOR FOUR WEEKS   Final    Report Status PENDING   Incomplete   CULTURE, RESPIRATORY     Status: Normal   Collection Time   10/27/11  3:22 PM      Component Value Range Status Comment   Specimen Description BRONCHIAL WASHINGS   Final    Special Requests ADDED ON [id number] @1658    Final    Gram Stain     Final    Value: RARE WBC PRESENT, PREDOMINANTLY PMN     NO SQUAMOUS EPITHELIAL CELLS SEEN     NO ORGANISMS SEEN   Culture NO GROWTH 2 DAYS   Final    Report Status 10/29/2011 FINAL   Final   AFB CULTURE, BLOOD     Status: Normal (Preliminary result)   Collection Time   10/28/11  5:15 AM      Component Value Range Status Comment   Specimen Description BLOOD LEFT   Final    Special Requests CVC IBV  Final    Culture     Final    Value: CULTURE WILL BE EXAMINED FOR 6 WEEKS BEFORE ISSUING A FINAL REPORT   Report Status PENDING   Incomplete   CRYPTOSPORIDIUM SMEAR, FECAL     Status: Normal   Collection Time   10/29/11  1:10 PM      Component Value Range Status Comment   Specimen Description STOOL   Final    Special  Requests NONE   Final    Cryptosporidium Smear.     Final    Value: NO CRYPTOSPORIDIUM SEEN NO ISOSPORA SEEN NO CYCLOSPORA SEEN   Report Status 10/31/2011 FINAL   Final   STOOL CULTURE     Status: Normal   Collection Time   10/29/11  1:10 PM      Component Value Range Status Comment   Specimen Description STOOL   Final    Special Requests NONE   Final    Culture     Final    Value: NO SALMONELLA, SHIGELLA, CAMPYLOBACTER, OR YERSINIA ISOLATED     Note: REDUCED NORMAL FLORA PRESENT   Report Status 11/02/2011 FINAL   Final   CLOSTRIDIUM DIFFICILE BY PCR     Status: Normal   Collection Time   11/02/11 12:55 AM      Component Value Range Status Comment   C difficile by pcr NEGATIVE  NEGATIVE  Final    Medications: I have reviewed the patient's current medications. Scheduled Meds:    . azithromycin  1,200 mg Oral Weekly  . feeding supplement  1 Container Oral TID BM  . folic acid  1 mg Oral Daily  . heparin  5,000 Units Subcutaneous Q8H  . insulin aspart  0-15 Units Subcutaneous TID WC  . multivitamins ther. w/minerals  1 tablet Oral Daily  . pantoprazole  40 mg Oral Q1200  . predniSONE  40 mg Oral BID  . sulfamethoxazole-trimethoprim  2 tablet Oral Q8H  . thiamine  100 mg Oral Daily   Or  . thiamine  100 mg Intravenous Daily  . white petrolatum       Continuous Infusions:  PRN Meds:.sodium chloride, LORazepam, LORazepam  Assessment/Plan: 1) PCP pneumonia: Continuing to wean oxygen, patient on 2 L of oxygen and O2 sat 99%.  -- Bactrim DS 2 tablet PO, Q8H (8/21 day therapy.  End date 11/25) Will then need Bactrim prophylaxis until CD4 count is >200  -- Prednisone decreased today to 40 mg, PO, daily (day 1/5), then on 11/17 will decrease to 20 mg PO daily for 11 days (total 21 day therapy to end November 25.)  -- continue to wean O2 therapy with humidified O2- titrate to O2 sat > 92% -- Will walk today while on oxygen monitor to see if he desats.  2) Newly diagnosed HIV/AIDS  (CD4< 10) : Pt had never been tested prior to this admission. Getting set up in the [facility], and  applying for emergency Medicaid to help his ability to receive and pay for therapy. Pt reports 2-3 sexual encounters since arriving in [place]; denies IV drug use  -- Still awaiting viral load and genotyping  -- Atripla each bedtime starting today per ID -- MAC prophylaxis - Indicated in pts with CD4<50. Continue azithromycin (ZITHROMAX) tablet 1,200 mg, PO weekly until CD4 >100 for more than three months  -- Bactrim per problem #1  3) Hyponatremia - Serum osmoles are normal indicating isotonic hyponatremia. Possible causes of isotonic hyponatremia are hyperlipidemia and hyperproteinemia.  Lipid panel shows elevated triglycerides and LDL. However correction only increases sodium to 131. High-dose Bactrim also could be contributing. Increased protein is unlikely given low albumin and normal total protein. -- Will recheck serum and urine osmoles at the prior values were about a week ago  4) Nausea - continue ondansetron (ZOFRAN) IV 4 mg, IV, Q6H PRN for nausea   5) Diarrhea - Pt reports and episode of diarrhea on evening of 10/29/11. Has not had another episode since then. Stools are now regular. Giardia and cryptosporidium screen were both negative.  -- follow-up on C.diff (ordered 10/31/11)  6) malnutrition: Given report of >10 lb weight loss in the last month, nutrition consult ordered. -- Resource Breeze by mouth 3 times a day  7) history of heavy EtOH use: Pt had CIWA of 9 at 1601 on 10/31/11 - has be 0 since. Continue to monitor   8) DVT prophylaxis: Subcutaneous heparin   LOS: 7 days   Gerald Cox 11/03/2011, 8:08 AM

## 2011-11-04 ENCOUNTER — Encounter (HOSPITAL_COMMUNITY): Payer: Self-pay | Admitting: General Practice

## 2011-11-04 LAB — DIFFERENTIAL
Basophils Absolute: 0 10*3/uL (ref 0.0–0.1)
Basophils Relative: 0 % (ref 0–1)
Eosinophils Absolute: 0.1 10*3/uL (ref 0.0–0.7)
Lymphocytes Relative: 10 % — ABNORMAL LOW (ref 12–46)
Lymphs Abs: 0.7 10*3/uL (ref 0.7–4.0)
Monocytes Absolute: 1.5 10*3/uL — ABNORMAL HIGH (ref 0.1–1.0)
Neutro Abs: 4.4 10*3/uL (ref 1.7–7.7)

## 2011-11-04 LAB — COMPREHENSIVE METABOLIC PANEL
Alkaline Phosphatase: 114 U/L (ref 39–117)
BUN: 14 mg/dL (ref 6–23)
CO2: 26 mEq/L (ref 19–32)
Calcium: 9.1 mg/dL (ref 8.4–10.5)
Creatinine, Ser: 0.62 mg/dL (ref 0.50–1.35)
Glucose, Bld: 73 mg/dL (ref 70–99)
Total Bilirubin: 0.2 mg/dL — ABNORMAL LOW (ref 0.3–1.2)
Total Protein: 7.6 g/dL (ref 6.0–8.3)

## 2011-11-04 LAB — CBC
HCT: 35.6 % — ABNORMAL LOW (ref 39.0–52.0)
Hemoglobin: 11.9 g/dL — ABNORMAL LOW (ref 13.0–17.0)
MCH: 30.2 pg (ref 26.0–34.0)
MCHC: 33.4 g/dL (ref 30.0–36.0)
RDW: 15.6 % — ABNORMAL HIGH (ref 11.5–15.5)

## 2011-11-04 LAB — OSMOLALITY, URINE: Osmolality, Ur: 502 mOsm/kg (ref 390–1090)

## 2011-11-04 LAB — GLUCOSE, CAPILLARY: Glucose-Capillary: 165 mg/dL — ABNORMAL HIGH (ref 70–99)

## 2011-11-04 LAB — COCCIDIOIDES ANTIBODIES: Coccidioides Ab CF: <1:2 {titer}

## 2011-11-04 LAB — HIV-1 RNA QUANT-NO REFLEX-BLD: HIV-1 RNA Quant, Log: 5.47 {Log} — ABNORMAL HIGH (ref <1.30)

## 2011-11-04 NOTE — Progress Notes (Signed)
Due to language barrier, an interpreter was present during the history-taking and subsequent discussion (and for part of the physical exam) with this patient. Interpreter Wyvonnia Dusky helping Gerald Cox for admitting History 12.15 pm 11/04/2011

## 2011-11-04 NOTE — Progress Notes (Signed)
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  Pt reports no acute events overnight. Pt reports he is feeling better. Pt is still coughing but frequency has decreased.  He is still short of breath at times, but pt says it is improving. He was up and walking the halls yesterday. Pt is currently on 2 L O2 nasal cannula - 98% O2sat. Pt denies chest pain, HA, dizziness, lightheadedness, sore throat, nausea, vomiting, abdominal pain, changes in bowel or bladder habits or rash.   Pt has informed certain family members of his diagnosis. Pt states his sister will be present during ID clinic consultation to help him understand his diagnosis and associated therapy.   Objective: Vital signs in last 24 hours: Filed Vitals:   11/03/11 0604 11/03/11 1300 11/03/11 2200 11/04/11 0537  BP: 104/69 117/79 113/74 112/74  Pulse: 83 105 104 88  Temp: 97 F (36.1 C) 97.6 F (36.4 C) 97.7 F (36.5 C) 97.3 F (36.3 C)  TempSrc: Oral Oral Oral Oral  Resp: 20 20 20 20   Weight:      SpO2: 99% 96% 96% 98%   Weight change:   Intake/Output Summary (Last 24 hours) at 11/04/11 1406 Last data filed at 11/04/11 0900  Gross per 24 hour  Intake    960 ml  Output    800 ml  Net    160 ml   Physical Exam:  General: resting in bed, NAD Cardiac: RRR, no rubs, murmurs or gallops appreciated Pulm: shallow depth of breathing, mild bilateral ronchi at the bases only - much improved from yesterday Abdomen: Soft, nontender nondistended positive bowel sounds. Extremities: No pedal edema. Neuro: alert and oriented X3, cranial nerves II-XII grossly intact Lab Results: Basic Metabolic Panel:  Lab 11/04/11 0454 11/03/11 0702 10/29/11 0330  NA 132* 130* --  K 4.1 4.6 --  CL 95* 96 --  CO2 26 20 --  GLUCOSE 73 133* --  BUN 14 16 --  CREATININE 0.62 0.59 --  CALCIUM 9.1 9.2 --  MG -- -- 2.3  PHOS -- -- 2.9   A1c = 5.9  Serum Osm:  284 Urine Osm: 477 Urine Na: 85  Liver Function Tests:  Lab 11/04/11 0705 11/03/11 0702  AST 78* 83*  ALT 137* 134*  ALKPHOS 114 112  BILITOT 0.2* 0.2*  PROT 7.6 7.7  ALBUMIN 2.0* 1.9*   CBC:  Lab 11/04/11 0705 11/03/11 0702 11/01/11 0630  WBC 6.7 7.5 --  NEUTROABS 4.4 -- 5.3  HGB 11.9* 12.0* --  HCT 35.6* 35.4* --  MCV 90.4 89.6 --  PLT 415* 413* --   CBG:  Lab 11/04/11 0801 11/03/11 2203 11/03/11 1646 11/03/11 1233 11/03/11 0859 11/02/11 2116  GLUCAP 88 182* 138* 138* 368* 139*   Micro Results: Recent Results (from the past 240 hour(s))  CULTURE, BLOOD (ROUTINE X 2)     Status: Normal   Collection Time   10/27/11  4:15 AM      Component Value Range Status Comment   Specimen Description BLOOD ARM RIGHT   Final    Special Requests     Final    Value: BOTTLES DRAWN AEROBIC AND ANAEROBIC 8CC  PT ON FLAGYL,CIPRO   Setup Time [id number]   Final    Culture     Final    Value: PROPIONIBACTERIUM SPECIES     Note: Gram Stain Report Called to,Read Back By and Verified  With: DAWN RILEY 10/31/2011 @ 2205 HAJAM   Report Status 11/03/2011 FINAL   Final   CULTURE, BLOOD (ROUTINE X 2)     Status: Normal   Collection Time   10/27/11  4:15 AM      Component Value Range Status Comment   Specimen Description BLOOD RIGHT ARM   Final    Special Requests     Final    Value: BOTTLES DRAWN AEROBIC AND ANAEROBIC 10CC EACH PATIENT ON FOLLOWING CIPRO,FLAGYL   Setup Time [id number]   Final    Culture NO GROWTH 5 DAYS   Final    Report Status 11/02/2011 FINAL   Final   URINE CULTURE     Status: Normal   Collection Time   10/27/11  6:20 AM      Component Value Range Status Comment   Specimen Description URINE, RANDOM   Final    Special Requests NONE   Final    Setup Time [id number]   Final    Colony Count NO GROWTH   Final    Culture NO GROWTH   Final    Report Status 10/28/2011 FINAL   Final   MRSA PCR SCREENING     Status: Normal   Collection Time   10/27/11  9:42 AM       Component Value Range Status Comment   MRSA by PCR NEGATIVE  NEGATIVE  Final   AFB CULTURE WITH SMEAR     Status: Normal (Preliminary result)   Collection Time   10/27/11  3:22 PM      Component Value Range Status Comment   Specimen Description BRONCHIAL WASHINGS   Final    Special Requests NONE   Final    ACID FAST SMEAR NO ACID FAST BACILLI SEEN   Final    Culture     Final    Value: CULTURE WILL BE EXAMINED FOR 6 WEEKS BEFORE ISSUING A FINAL REPORT   Report Status PENDING   Incomplete   PNEUMOCYSTIS JIROVECI SMEAR BY DFA     Status: Normal   Collection Time   10/27/11  3:22 PM      Component Value Range Status Comment   Specimen Source-PJ[facility] BRONCHIAL WASHINGS   Final    Pneumocystis jiroveci Ag POSITIVE   Final   FUNGUS CULTURE W SMEAR     Status: Normal (Preliminary result)   Collection Time   10/27/11  3:22 PM      Component Value Range Status Comment   Specimen Description BRONCHIAL WASHINGS   Final    Special Requests ADDED ON [id number] @1649    Final    Fungal Smear NO YEAST OR FUNGAL ELEMENTS SEEN   Final    Culture CULTURE IN PROGRESS FOR FOUR WEEKS   Final    Report Status PENDING   Incomplete   CULTURE, RESPIRATORY     Status: Normal   Collection Time   10/27/11  3:22 PM      Component Value Range Status Comment   Specimen Description BRONCHIAL WASHINGS   Final    Special Requests ADDED ON [id number] @1658    Final    Gram Stain     Final    Value: RARE WBC PRESENT, PREDOMINANTLY PMN     NO SQUAMOUS EPITHELIAL CELLS SEEN     NO ORGANISMS SEEN   Culture NO GROWTH 2 DAYS   Final    Report Status 10/29/2011 FINAL   Final   AFB CULTURE, BLOOD     Status: Normal (  Preliminary result)   Collection Time   10/28/11  5:15 AM      Component Value Range Status Comment   Specimen Description BLOOD LEFT   Final    Special Requests CVC IBV   Final    Culture     Final    Value: CULTURE WILL BE EXAMINED FOR 6 WEEKS BEFORE ISSUING A FINAL REPORT   Report Status PENDING   Incomplete     CRYPTOSPORIDIUM SMEAR, FECAL     Status: Normal   Collection Time   10/29/11  1:10 PM      Component Value Range Status Comment   Specimen Description STOOL   Final    Special Requests NONE   Final    Cryptosporidium Smear.     Final    Value: NO CRYPTOSPORIDIUM SEEN NO ISOSPORA SEEN NO CYCLOSPORA SEEN   Report Status 10/31/2011 FINAL   Final   STOOL CULTURE     Status: Normal   Collection Time   10/29/11  1:10 PM      Component Value Range Status Comment   Specimen Description STOOL   Final    Special Requests NONE   Final    Culture     Final    Value: NO SALMONELLA, SHIGELLA, CAMPYLOBACTER, OR YERSINIA ISOLATED     Note: REDUCED NORMAL FLORA PRESENT   Report Status 11/02/2011 FINAL   Final   CLOSTRIDIUM DIFFICILE BY PCR     Status: Normal   Collection Time   11/02/11 12:55 AM      Component Value Range Status Comment   C difficile by pcr NEGATIVE  NEGATIVE  Final     HIV-1 Western Blot: Positive HIV-2 Ab: Negative HIV: Reactive (A) HIV 1 RNA Quant: 293000 (H)  [<20 copies/mL] HIV1 RNA Quant, Log: 5.47 (H)   Medications: I have reviewed the patient's current medications. Scheduled Meds:    . azithromycin  1,200 mg Oral Weekly  . efavirenz-emtrictabine-tenofovir  1 tablet Oral QHS  . feeding supplement  1 Container Oral TID BM  . folic acid  1 mg Oral Daily  . heparin  5,000 Units Subcutaneous Q8H  . insulin aspart  0-15 Units Subcutaneous TID WC  . multivitamins ther. w/minerals  1 tablet Oral Daily  . pantoprazole  40 mg Oral Q1200  . predniSONE  40 mg Oral QAC breakfast  . sulfamethoxazole-trimethoprim  2 tablet Oral Q8H  . thiamine  100 mg Oral Daily  . DISCONTD: thiamine  100 mg Intravenous Daily   Continuous Infusions:  PRN Meds:.sodium chloride, LORazepam, LORazepam  Assessment/Plan: 40 year old Spanish speaking man from [place] originally presenting with fever, cough, shortness of breath, 10 lb weight loss and diarrhea - found to have HIV/AIDS (CD4<10)  and PCP. Pt also reports heavy alcohol use.    1) PCP pneumonia: - continue current therapy per ID recommendations  - sulfamethoxazole-trimethoprim (BACTRIM/SEPTRA DS) tablet 800-160 mg 2 tablet,  PO, Q8H  (total 21 day therapy / currently DAY 9 of 21) End date 11/25. Will then need Bactrim prophylaxis until CD4 count is >200  - predniSONE (DELTASONE) tablet 40 mg, PO, daily (DAY 2 of 5) - convert to 40 mg  PO Daily on 11/03/11 for 5 days, then decrease to 20 mg PO daily on 11/08/11  for 11 days (total 21 day therapy to end November 25.) - Continue O2 therapy titrated to >92% - sating well on 2L (98%), continue to monitor  and wean from O2 - Pt  encouraged to use incentive spirometer every hour - Pt encouraged tocontinue getting up and out of bed   2) Newly diagnosed HIV/AIDS (CD4< 10)  : Pt had never been tested prior to this admission. Pt reports 2-3 sexual encounters since arriving in [place]; denies IV drug use  - VL of 293000  - Still awaiting genotype  - Pt started on Efavirenz-emtrictabine-tenofovir (ATRIPLA) tablet 600-200-300 mg 1  tablet, PO, QHS - MAC prophylaxis as indicated in pt with CD5<50. Continue Azithromycin (ZITHROMAX) tablet 1,200 mg, PO weekly until CD4>100 for three consecutive months - [facility], and  applying for emergency Medicaid to help his ability to receive and pay for therapy. Will be seen by [facility] RN tomorrow to coordinate care - Bactrim per problem #1  3) Hyponatremia - Na+ has been trending up.  Na increased from 130 (11/03/11)  132 (11/04/11)  - remains mildly hyponatremic today. According to UptoDate - Hyponatremia is fairly common in HIV/AIDS pts (35-55% of hospitalized HIV-infected Pts) Causes include siADH, volume depletion, adrenal insufficiency (often due to adrenalitis due to CMV, MAC or HIV) - Also, high dose Bactrim can cause hyponatremia (in addition to hyperkalemia)  - Serum  are within normal limits, still awaiting urine Osm  -  Continue to monitor Na level - BMET in am  - No plans to treat at the moment, will reassess tomorrow to ensure positive trend  4) Nausea - continue ondansetron (ZOFRAN) IV 4 mg, IV, Q6H PRN for nausea    5) Diarrhea - Pt reports and episode of diarrhea on evening of 10/29/11. Has not had another episode since then. Stools are now regular. Giardia and cryptosporidium screen were both negative.  -- C.diff negative  6) malnutrition: Given report of >10 lb weight loss in the last month, nutrition consult ordered. -- Resource Breeze by mouth 3 times a day  7) history of heavy EtOH use: Pt had CIWA of 9 at 1601 on 10/31/11 - has be 0 since. Continue to monitor   8) DVT prophylaxis: Subcutaneous heparin   LOS: 8 days   Gaylin Bulthuis 11/04/2011, 2:06 PM

## 2011-11-05 LAB — BASIC METABOLIC PANEL
BUN: 17 mg/dL (ref 6–23)
Chloride: 94 mEq/L — ABNORMAL LOW (ref 96–112)
GFR calc Af Amer: >90 mL/min (ref >90)
Glucose, Bld: 95 mg/dL (ref 70–99)
Potassium: 3.9 mEq/L (ref 3.5–5.1)

## 2011-11-05 LAB — GLUCOSE, CAPILLARY: Glucose-Capillary: 141 mg/dL — ABNORMAL HIGH (ref 70–99)

## 2011-11-05 NOTE — Progress Notes (Signed)
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 Pt reports his cough and shortness of breath have improved.  He has been using his incentive spirometer regularly and has been up and walking in his room. He understands he has a meeting with [facility] at 3pm today 11/05/11. He states his family will be in attendance  He states he is not able to read and write very well - so he will be relying on sister and family to help him.  Pt denies chest pain, HA, dizziness, lightheadedness, sore throat, nausea, vomiting, abdominal pain, changes in bowel or bladder habits or rash.   Objective: Vital signs in last 24 hours: Filed Vitals:   11/04/11 1500 11/04/11 2100 11/05/11 0550 11/05/11 0800  BP: 115/72 108/68 118/81   Pulse: 86 111 92   Temp: 97.6 F (36.4 C) 97.3 F (36.3 C) 97.6 F (36.4 C)   TempSrc: Oral  Oral   Resp: 20 18 22    Weight:      SpO2: 97% 97% 97% 97%   Weight change:   Intake/Output Summary (Last 24 hours) at 11/05/11 1435 Last data filed at 11/05/11 1300  Gross per 24 hour  Intake   1320 ml  Output   1475 ml  Net   -155 ml   Physical Exam:  General: resting in bed, NAD Cardiac: RRR, no rubs, murmurs or gallops appreciated Pulm: normal work of breathing,  lungs clear in auscultation bilaterally Abdomen: Soft, nontender nondistended positive bowel sounds. Extremities: No pedal edema. Neuro: alert and oriented X3, cranial nerves II-XII grossly intact  Lab Results: Basic Metabolic Panel:  Lab 11/05/11 9604 11/04/11 0705  NA 129* 132*  K 3.9 4.1  CL 94* 95*  CO2 22 26  GLUCOSE 95 73  BUN 17 14  CREATININE 0.59 0.62  CALCIUM 9.3 9.1  MG -- --  PHOS -- --   A1c = 5.9   OSM Serum   279 [ 275 - 300 mOsm/kg]  OSM Urine   502 [390 - 1090 mOsm/kg] Urine Na: 85  Liver Function Tests:  Lab 11/04/11 0705 11/03/11 0702  AST 78* 83*  ALT 137* 134*  ALKPHOS 114 112    BILITOT 0.2* 0.2*  PROT 7.6 7.7  ALBUMIN 2.0* 1.9*   CBC:  Lab 11/04/11 0705 11/03/11 0702 11/01/11 0630  WBC 6.7 7.5 --  NEUTROABS 4.4 -- 5.3  HGB 11.9* 12.0* --  HCT 35.6* 35.4* --  MCV 90.4 89.6 --  PLT 415* 413* --   CBG:  Lab 11/05/11 1233 11/05/11 0844 11/04/11 2226 11/04/11 1707 11/04/11 1234 11/04/11 0801  GLUCAP 141* 125* 165* 162* 112* 88   Micro Results: Recent Results (from the past 240 hour(s))  CULTURE, BLOOD (ROUTINE X 2)     Status: Normal   Collection Time   10/27/11  4:15 AM      Component Value Range Status Comment   Specimen Description BLOOD ARM RIGHT   Final    Special Requests     Final    Value: BOTTLES DRAWN AEROBIC AND ANAEROBIC 8CC  PT ON FLAGYL,CIPRO   Setup Time [id number]   Final    Culture     Final    Value: PROPIONIBACTERIUM SPECIES     Note: Gram Stain Report Called to,Read Back By and Verified With: DAWN RILEY 10/31/2011 @ 2205 HAJAM   Report Status 11/03/2011  FINAL   Final   CULTURE, BLOOD (ROUTINE X 2)     Status: Normal   Collection Time   10/27/11  4:15 AM      Component Value Range Status Comment   Specimen Description BLOOD RIGHT ARM   Final    Special Requests     Final    Value: BOTTLES DRAWN AEROBIC AND ANAEROBIC 10CC EACH PATIENT ON FOLLOWING CIPRO,FLAGYL   Setup Time [id number]   Final    Culture NO GROWTH 5 DAYS   Final    Report Status 11/02/2011 FINAL   Final   URINE CULTURE     Status: Normal   Collection Time   10/27/11  6:20 AM      Component Value Range Status Comment   Specimen Description URINE, RANDOM   Final    Special Requests NONE   Final    Setup Time [id number]   Final    Colony Count NO GROWTH   Final    Culture NO GROWTH   Final    Report Status 10/28/2011 FINAL   Final   MRSA PCR SCREENING     Status: Normal   Collection Time   10/27/11  9:42 AM      Component Value Range Status Comment   MRSA by PCR NEGATIVE  NEGATIVE  Final   AFB CULTURE WITH SMEAR     Status: Normal (Preliminary result)    Collection Time   10/27/11  3:22 PM      Component Value Range Status Comment   Specimen Description BRONCHIAL WASHINGS   Final    Special Requests NONE   Final    ACID FAST SMEAR NO ACID FAST BACILLI SEEN   Final    Culture     Final    Value: CULTURE WILL BE EXAMINED FOR 6 WEEKS BEFORE ISSUING A FINAL REPORT   Report Status PENDING   Incomplete   PNEUMOCYSTIS JIROVECI SMEAR BY DFA     Status: Normal   Collection Time   10/27/11  3:22 PM      Component Value Range Status Comment   Specimen Source-[facility] BRONCHIAL WASHINGS   Final    Pneumocystis jiroveci Ag POSITIVE   Final   FUNGUS CULTURE W SMEAR     Status: Normal (Preliminary result)   Collection Time   10/27/11  3:22 PM      Component Value Range Status Comment   Specimen Description BRONCHIAL WASHINGS   Final    Special Requests ADDED ON [id number] @1649    Final    Fungal Smear NO YEAST OR FUNGAL ELEMENTS SEEN   Final    Culture CULTURE IN PROGRESS FOR FOUR WEEKS   Final    Report Status PENDING   Incomplete   CULTURE, RESPIRATORY     Status: Normal   Collection Time   10/27/11  3:22 PM      Component Value Range Status Comment   Specimen Description BRONCHIAL WASHINGS   Final    Special Requests ADDED ON [id number] @1658    Final    Gram Stain     Final    Value: RARE WBC PRESENT, PREDOMINANTLY PMN     NO SQUAMOUS EPITHELIAL CELLS SEEN     NO ORGANISMS SEEN   Culture NO GROWTH 2 DAYS   Final    Report Status 10/29/2011 FINAL   Final   AFB CULTURE, BLOOD     Status: Normal (Preliminary result)   Collection Time   10/28/11  5:15 AM  Component Value Range Status Comment   Specimen Description BLOOD LEFT   Final    Special Requests CVC IBV   Final    Culture     Final    Value: CULTURE WILL BE EXAMINED FOR 6 WEEKS BEFORE ISSUING A FINAL REPORT   Report Status PENDING   Incomplete   CRYPTOSPORIDIUM SMEAR, FECAL     Status: Normal   Collection Time   10/29/11  1:10 PM      Component Value Range Status Comment   Specimen  Description STOOL   Final    Special Requests NONE   Final    Cryptosporidium Smear.     Final    Value: NO CRYPTOSPORIDIUM SEEN NO ISOSPORA SEEN NO CYCLOSPORA SEEN   Report Status 10/31/2011 FINAL   Final   STOOL CULTURE     Status: Normal   Collection Time   10/29/11  1:10 PM      Component Value Range Status Comment   Specimen Description STOOL   Final    Special Requests NONE   Final    Culture     Final    Value: NO SALMONELLA, SHIGELLA, CAMPYLOBACTER, OR YERSINIA ISOLATED     Note: REDUCED NORMAL FLORA PRESENT   Report Status 11/02/2011 FINAL   Final   CLOSTRIDIUM DIFFICILE BY PCR     Status: Normal   Collection Time   11/02/11 12:55 AM      Component Value Range Status Comment   C difficile by pcr NEGATIVE  NEGATIVE  Final     HIV-1 Western Blot: Positive HIV-2 Ab: Negative HIV: Reactive (A) HIV 1 RNA Quant: 293000 (H)  [<20 copies/mL] HIV1 RNA Quant, Log: 5.47 (H)   Medications: I have reviewed the patient's current medications. Scheduled Meds:    . azithromycin  1,200 mg Oral Weekly  . efavirenz-emtrictabine-tenofovir  1 tablet Oral QHS  . feeding supplement  1 Container Oral TID BM  . folic acid  1 mg Oral Daily  . heparin  5,000 Units Subcutaneous Q8H  . insulin aspart  0-15 Units Subcutaneous TID WC  . multivitamins ther. w/minerals  1 tablet Oral Daily  . pantoprazole  40 mg Oral Q1200  . predniSONE  40 mg Oral QAC breakfast  . sulfamethoxazole-trimethoprim  2 tablet Oral Q8H  . thiamine  100 mg Oral Daily   Continuous Infusions:  PRN Meds:.sodium chloride  Assessment/Plan: 40 year old Spanish speaking man from [place] originally presenting with fever, cough, shortness of breath, 10 lb weight loss and diarrhea - found to have HIV/AIDS (CD4<10) and PCP. Pt also reports heavy alcohol use.    1) PCP pneumonia: - continue current therapy per ID recommendations  - sulfamethoxazole-trimethoprim (BACTRIM/SEPTRA DS) tablet 800-160 mg 2 tablet, PO, Q8H  (total  21 day therapy / currently DAY 10 of 21) End date 11/25. Will then need Bactrim prophylaxis until CD4 count is >200  - predniSONE (DELTASONE) tablet 40 mg, PO, daily (DAY 3 of 5) - then decrease to 20 mg PO daily on 11/08/11  for 11 days (total 21 day therapy to end November 25.) - Weaned from O2 - sating well on NA (97%),  - Pt encouraged to use incentive spirometer every hour - Pt encouraged tocontinue getting up and out of bed   2) Newly diagnosed HIV/AIDS (CD4< 10)  : Pt had never been tested prior to this admission. Pt reports 2-3 sexual encounters since arriving in [place]; denies IV drug use  - VL  of 293000  - Still awaiting genotype  - Pt started on Efavirenz-emtrictabine-tenofovir (ATRIPLA) tablet 600-200-300 mg 1 tablet, PO, QHS - MAC prophylaxis as indicated in pt with CD5<50. Continue Azithromycin (ZITHROMAX) tablet 1,200 mg, PO weekly until CD4>100 for three consecutive months - Bactrim per problem #1             - Getting set up in the [facility], and  applying for emergency Medicaid to help his ability to receive and pay for therapy. Will be seen by [facility] RN today to coordinate care 3) Hyponatremia -  remains mildly hyponatremic today. sodium 129 today.. According to UptoDate - Hyponatremia is fairly common in HIV/AIDS pts (35-55% of hospitalized HIV-infected Pts).  Alternate causes include siADH, volume depletion, adrenal insufficiency (often due to adrenalitis due to CMV, MAC or HIV) - Also, high dose Bactrim can cause hyponatremia (in addition to hyperkalemia)  - Serum osmoles  are within normal limits, urine osmoles inappropriately high  - This is below the threshold for treatment. But will need adequate followup if discharged  4) Nausea - continue ondansetron (ZOFRAN) IV 4 mg, IV, Q6H PRN for nausea  -- Resolved  5) Diarrhea - Pt reports and episode of diarrhea on evening of 10/29/11. Has not had another episode since then. Stools are now regular. Giardia and cryptosporidium screen were  both negative.  -- Resolved -- C.diff negative  6) malnutrition: Given report of >10 lb weight loss in the last month, nutrition consult ordered. -- Resource Breeze by mouth 3 times a day  7) history of heavy EtOH use: Pt had CIWA of 9 at 1601 on 10/31/11 - has be 0 since.  Unlikely to withdraw at this point.  8) DVT prophylaxis: Subcutaneous heparin   LOS: 9 days   Mailani Degroote 11/05/2011, 2:35 PM

## 2011-11-05 NOTE — Progress Notes (Signed)
Due to language barrier, an interpreter was present during the history-taking and subsequent discussion (and for part of the physical exam) with this patient. Interpreter Wyvonnia Dusky 11/05/2011  [facility] and Dr Drue Second at 15.00pm

## 2011-11-05 NOTE — Progress Notes (Signed)
Physical Therapy Treatment Patient Details Name: Gerald Cox MRN: [id number] DOB: Dec 27, 1970 Today's Date: 11/05/2011  PT Assessment/Plan  PT - Assessment/Plan Comments on Treatment Session: Pt did well today.  Pt on room air when PT came into room, SpO2 = 96%, PT ambulated pt with O2 nearby if necessary however pt never dropped below SpO2 = 93% on room air. Pt strong however does show signs of fatigue with ambulation and will still benefit from f/u PT. Acute PT needs appear to have been met and pt near baseline. Will D/C PT, please reorder if needed.  PT Plan: Discharge plan remains appropriate PT Frequency: Min 3X/week Follow Up Recommendations: Home health PT Equipment Recommended: None recommended by PT PT Goals  Acute Rehab PT Goals PT Goal: Supine/Side to Sit - Progress: Met PT Transfer Goal: Sit to Stand/Stand to Sit - Progress: Met PT Transfer Goal: Bed to Chair/Chair to Bed - Progress: Met PT Goal: Ambulate - Progress: Met PT Goal: Up/Down Stairs - Progress: Met  PT Treatment Precautions/Restrictions  Precautions Precautions: Fall Precaution Comments: Slightly impulsive Restrictions Weight Bearing Restrictions: No Mobility (including Balance) Bed Mobility Bed Mobility: Yes Supine to Sit: 7: Independent Transfers Transfers: Yes Sit to Stand: 7: Independent Stand to Sit: 7: Independent Ambulation/Gait Ambulation/Gait: Yes Ambulation/Gait Assistance: 5: Supervision Ambulation/Gait Assistance Details (indicate cue type and reason): pt with lateral side stepping with head turns, otherwise pt is modified independent Ambulation Distance (Feet): 450 Feet Assistive device: None Gait Pattern: Within Functional Limits Stairs: No    Exercise  General Exercises - Lower Extremity Mini-Sqauts: AROM;Strengthening;Both;20 reps;Standing End of Session PT - End of Session Equipment Utilized During Treatment: Gait belt Activity Tolerance: Patient tolerated treatment  well (some fatigue) Patient left: in bed;with call bell in reach;with family/visitor present (Case manager present) Nurse Communication:  (O2 stats) General Behavior During Session: [facility] for tasks performed Cognition: [facility] for tasks performed  Ellin Saba) [id number] 11/05/2011, 2:58 PM

## 2011-11-05 NOTE — Progress Notes (Signed)
Infectious Diseases Progress Note:  ID: 40yo [place] Male newly diagnosed HIV+ (CD4 < 10 /VL pending) presents with acute respiratory failure 2/2 PCP pneumonia s/p intubation and pressors now extubated, and normotensive   24hr events/subjective:  Remains afebrile. With the aid of an interpreter, the patient states he still feels occasionally short of breath, no chest pain, no fevers/chills. Appetite is improved, less nausea, and no diarrhea.  we spent 10 minutes discussing importance of medication adherence. The patient mentions that he can only read alittle bit of spanish. He is being seen by Rubbie Battiest counselor to help him understand the services offered at HIV clinic  ABtx:  bactrim DS 2 tabs TID, pred 40mg  daily, azithro 1200mg  Qwk, atripla qhs  Medications: I have reviewed the patient's current medications.  Objective: Vital signs in last 24 hours: Temp:  [97.3 F (36.3 C)-97.6 F (36.4 C)] 97.3 F (36.3 C) (11/14 1435) Pulse Rate:  [92-111] 104  (11/14 1435) Resp:  [18-22] 20  (11/14 1435) BP: (108-118)/(68-81) 116/72 mmHg (11/14 1435) SpO2:  [93 %-97 %] 96 % (11/14 1451) Weight change:  Last BM Date: 11/04/11  Gen: awake sitting in bed, answers questions appropriately > 75% of time HEENT: pupils PERRLA, no scleral icterus, mild thrush on tonge Neck = supple, (left ij pulled) Pulm= mild  bilateral rhonchi, no acc. Muscle use Heart= tachy, nl s1, s2. No g/m/r Abd= decreased bowel sounds, non-distended, no HSM Ext = warm, no c/c/e Skin = no rash Neuro = Ax O by 2, CN2-12 intact, moves all limbs  Lab Results: CBC    Component Value Date/Time   WBC 6.7 11/04/2011 0705   RBC 3.94* 11/04/2011 0705   HGB 11.9* 11/04/2011 0705   HCT 35.6* 11/04/2011 0705   PLT 415* 11/04/2011 0705   MCV 90.4 11/04/2011 0705   MCH 30.2 11/04/2011 0705   MCHC 33.4 11/04/2011 0705   RDW 15.6* 11/04/2011 0705   LYMPHSABS 0.7 11/04/2011 0705   MONOABS 1.5* 11/04/2011 0705   EOSABS 0.1  11/04/2011 0705   BASOSABS 0.0 11/04/2011 0705   TLC: 300 CMP     Component Value Date/Time   NA 129* 11/05/2011 0750   K 3.9 11/05/2011 0750   CL 94* 11/05/2011 0750   CO2 22 11/05/2011 0750   GLUCOSE 95 11/05/2011 0750   BUN 17 11/05/2011 0750   CREATININE 0.59 11/05/2011 0750   CALCIUM 9.3 11/05/2011 0750   PROT 7.6 11/04/2011 0705   ALBUMIN 2.0* 11/04/2011 0705   AST 78* 11/04/2011 0705   ALT 137* 11/04/2011 0705   ALKPHOS 114 11/04/2011 0705   BILITOT 0.2* 11/04/2011 0705   GFRNONAA >90 11/05/2011 0750   GFRAA >90 11/05/2011 0750   hiv reactive cd4count < 10 VL 293000  MICRO: Blood Culture HCV negative HBsAg negative HBsAb negative HBcAb negative PJP positive AFB on bronchial wash is negative Blood culture pending BAL culture pending  Assessment/Plan: 1) 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 VL 293,000, genotyping pending. Started on Efavirenz-emtrictabine-tenofovir (ATRIPLA) tablet 600-200-300 mg 1 tablet, PO, QHS. I have counseled him on the side effects of atripla, that he may have vivid dreams. I have also mentioned that some people get a rash, and that he should contact the clinic if that occurs.  HIV Risk reduction counseling: he will need to use condoms and no longer have unprotected sex. Community counselor is providing him with condoms  2) acute respiratory distress 2/2 PCP pneumonia = currently on bactrim DS take 2 tablets TID, for a total  treatment period of 21 days. -  End date 11/25. Will then need Bactrim prophylaxis until CD4 count is >200  - predniSONE (DELTASONE) tablet 40 mg, PO, daily (DAY 3 of 5) - then decrease to 20 mg PO daily on 11/08/11 for 11 days (total 21 day therapy to end November 25.)   3)  diarrhea =  Resolved, in addition testing for Giardia, cryptosporidium negative. Not completely sure if patient has diarrhea. It appears that it has improved. If still 3BM or greater, please repeat stool culture  4) malnutrition = encourage good po  intake  5) OI prophylaxis = started azithromycin 1200mg  QFridays  Follow up, we will have a clinic appt to see patient in 3 wks at ID clinic, please page before he is discharged so that the patient is aware to come back to clinic.   LOS: 9 days   Judyann Munson 11/05/2011, 5:18 PM [phone]

## 2011-11-05 NOTE — Plan of Care (Signed)
Problem: Phase I Progression Outcomes Goal: Other Phase I Outcomes/Goals Pt. Will have no symptoms of ETOH withdrawal Document CIWA score every 6 hours.  Notify physician to re-start Ativan protocol if CIWA score greater than 2.   Refer to protocol for further interventions.

## 2011-11-05 NOTE — Progress Notes (Signed)
Patient is being discharged from PT services secondary to:  Goals met and no further therapy needs identified.  Please reorder if more physical deficits identified, thank you!!   Wilhemina Bonito PT, DPT  [phone]

## 2011-11-05 NOTE — Progress Notes (Signed)
ANTIBIOTIC CONSULT NOTE - FOLLOW UP  Pharmacy Consult for Bactrim Indication: PCP Pneumonia  No Known Allergies  Patient Measurements: Weight: 118 lb 9.7 oz (53.8 kg) Adjusted Body Weight:   Vital Signs: Temp: 97.6 F (36.4 C) (11/14 0550) Temp src: Oral (11/14 0550) BP: 118/81 mmHg (11/14 0550) Pulse Rate: 92  (11/14 0550) Intake/Output from previous day: 11/13 0701 - 11/14 0700 In: 1140 [P.O.:1140] Out: 1625 [Urine:1625] Intake/Output from this shift:    Labs:  [facility] 11/04/11 0705 11/03/11 0702  WBC 6.7 7.5  HGB 11.9* 12.0*  PLT 415* 413*  LABCREA -- --  CREATININE 0.62 0.59   CrCl is unknown because there is no height on file for the current visit. No results found for this basename: VANCOTROUGH:2,VANCOPEAK:2,VANCORANDOM:2,GENTTROUGH:2,GENTPEAK:2,GENTRANDOM:2,TOBRATROUGH:2,TOBRAPEAK:2,TOBRARND:2,AMIKACINPEAK:2,AMIKACINTROU:2,AMIKACIN:2, in the last 72 hours   Microbiology: Recent Results (from the past 720 hour(s))  CULTURE, BLOOD (ROUTINE X 2)     Status: Normal   Collection Time   10/27/11  4:15 AM      Component Value Range Status Comment   Specimen Description BLOOD ARM RIGHT   Final    Special Requests     Final    Value: BOTTLES DRAWN AEROBIC AND ANAEROBIC 8CC  PT ON FLAGYL,CIPRO   Setup Time [id number]   Final    Culture     Final    Value: PROPIONIBACTERIUM SPECIES     Note: Gram Stain Report Called to,Read Back By and Verified With: DAWN RILEY 10/31/2011 @ 2205 HAJAM   Report Status 11/03/2011 FINAL   Final   CULTURE, BLOOD (ROUTINE X 2)     Status: Normal   Collection Time   10/27/11  4:15 AM      Component Value Range Status Comment   Specimen Description BLOOD RIGHT ARM   Final    Special Requests     Final    Value: BOTTLES DRAWN AEROBIC AND ANAEROBIC 10CC EACH PATIENT ON FOLLOWING CIPRO,FLAGYL   Setup Time [id number]   Final    Culture NO GROWTH 5 DAYS   Final    Report Status 11/02/2011 FINAL   Final   URINE CULTURE     Status: Normal    Collection Time   10/27/11  6:20 AM      Component Value Range Status Comment   Specimen Description URINE, RANDOM   Final    Special Requests NONE   Final    Setup Time [id number]   Final    Colony Count NO GROWTH   Final    Culture NO GROWTH   Final    Report Status 10/28/2011 FINAL   Final   MRSA PCR SCREENING     Status: Normal   Collection Time   10/27/11  9:42 AM      Component Value Range Status Comment   MRSA by PCR NEGATIVE  NEGATIVE  Final   AFB CULTURE WITH SMEAR     Status: Normal (Preliminary result)   Collection Time   10/27/11  3:22 PM      Component Value Range Status Comment   Specimen Description BRONCHIAL WASHINGS   Final    Special Requests NONE   Final    ACID FAST SMEAR NO ACID FAST BACILLI SEEN   Final    Culture     Final    Value: CULTURE WILL BE EXAMINED FOR 6 WEEKS BEFORE ISSUING A FINAL REPORT   Report Status PENDING   Incomplete   PNEUMOCYSTIS JIROVECI SMEAR BY DFA     Status:  Normal   Collection Time   10/27/11  3:22 PM      Component Value Range Status Comment   Specimen Source-PJSRC BRONCHIAL WASHINGS   Final    Pneumocystis jiroveci Ag POSITIVE   Final   FUNGUS CULTURE W SMEAR     Status: Normal (Preliminary result)   Collection Time   10/27/11  3:22 PM      Component Value Range Status Comment   Specimen Description BRONCHIAL WASHINGS   Final    Special Requests ADDED ON [id number] @1649    Final    Fungal Smear NO YEAST OR FUNGAL ELEMENTS SEEN   Final    Culture CULTURE IN PROGRESS FOR FOUR WEEKS   Final    Report Status PENDING   Incomplete   CULTURE, RESPIRATORY     Status: Normal   Collection Time   10/27/11  3:22 PM      Component Value Range Status Comment   Specimen Description BRONCHIAL WASHINGS   Final    Special Requests ADDED ON [id number] @1658    Final    Gram Stain     Final    Value: RARE WBC PRESENT, PREDOMINANTLY PMN     NO SQUAMOUS EPITHELIAL CELLS SEEN     NO ORGANISMS SEEN   Culture NO GROWTH 2 DAYS   Final    Report Status  10/29/2011 FINAL   Final   AFB CULTURE, BLOOD     Status: Normal (Preliminary result)   Collection Time   10/28/11  5:15 AM      Component Value Range Status Comment   Specimen Description BLOOD LEFT   Final    Special Requests CVC IBV   Final    Culture     Final    Value: CULTURE WILL BE EXAMINED FOR 6 WEEKS BEFORE ISSUING A FINAL REPORT   Report Status PENDING   Incomplete   CRYPTOSPORIDIUM SMEAR, FECAL     Status: Normal   Collection Time   10/29/11  1:10 PM      Component Value Range Status Comment   Specimen Description STOOL   Final    Special Requests NONE   Final    Cryptosporidium Smear.     Final    Value: NO CRYPTOSPORIDIUM SEEN NO ISOSPORA SEEN NO CYCLOSPORA SEEN   Report Status 10/31/2011 FINAL   Final   STOOL CULTURE     Status: Normal   Collection Time   10/29/11  1:10 PM      Component Value Range Status Comment   Specimen Description STOOL   Final    Special Requests NONE   Final    Culture     Final    Value: NO SALMONELLA, SHIGELLA, CAMPYLOBACTER, OR YERSINIA ISOLATED     Note: REDUCED NORMAL FLORA PRESENT   Report Status 11/02/2011 FINAL   Final   CLOSTRIDIUM DIFFICILE BY PCR     Status: Normal   Collection Time   11/02/11 12:55 AM      Component Value Range Status Comment   C difficile by pcr NEGATIVE  NEGATIVE  Final     Anti-infectives     Start     Dose/Rate Route Frequency Ordered Stop   11/03/11 2200  efavirenz-emtrictabine-tenofovir (ATRIPLA) 600-200-300 MG per tablet 1 tablet       1 tablet Oral Daily at bedtime 11/03/11 1223     10/31/11 0000   azithromycin (ZITHROMAX) tablet 1,200 mg        1,200 mg Oral Weekly  10/30/11 1534     10/30/11 2200  sulfamethoxazole-trimethoprim (BACTRIM DS) 800-160 MG per tablet 2 tablet       2 tablet Oral 3 times per day 10/30/11 1534     10/27/11 1300   vancomycin (VANCOCIN) IVPB 1000 mg/200 mL premix  Status:  Discontinued        1,000 mg 200 mL/hr over 60 Minutes Intravenous Every 8 hours 10/27/11 1028  10/29/11 1051   10/27/11 1300   piperacillin-tazobactam (ZOSYN) IVPB 3.375 g  Status:  Discontinued        3.375 g 100 mL/hr over 30 Minutes Intravenous 3 times per day 10/27/11 1028 10/29/11 1051   10/27/11 1100   sulfamethoxazole-trimethoprim (BACTRIM) 320 mg in dextrose 5 % 500 mL IVPB  Status:  Discontinued        320 mg 346.7 mL/hr over 90 Minutes Intravenous 3 times per day 10/27/11 1028 10/30/11 1534   10/27/11 0900   Levofloxacin (LEVAQUIN) IVPB 750 mg  Status:  Discontinued     Comments: STAT!!!!!!!!!!!      750 mg 100 mL/hr over 90 Minutes Intravenous Every 24 hours 10/27/11 0857 10/28/11 1144   10/27/11 0900   sulfamethoxazole-trimethoprim (BACTRIM) 5 mg/kg/day in dextrose 5 % 500 mL IVPB  Status:  Discontinued     Comments: STAT for PCOP empiric pharamcy dose all now please including load      5 mg/kg/day 333.3 mL/hr over 90 Minutes Intravenous 3 times per day 10/27/11 0857 10/27/11 1029   10/27/11 0500   vancomycin (VANCOCIN) IVPB 1000 mg/200 mL premix        1,000 mg 200 mL/hr over 60 Minutes Intravenous  Once 10/27/11 0454 10/27/11 0649   10/27/11 0500   piperacillin-tazobactam (ZOSYN) IVPB 3.375 g        3.375 g 12.5 mL/hr over 240 Minutes Intravenous  Once 10/27/11 0454 10/27/11 0914          Assessment: 40 yo M on Bactrim D10/21 for PCP pneumonia. Patient is newly diagnosed HIV with CD4 < 10. Patient is on Atripla and weekly Azithromycin for MAC prophylaxis. Current PCP treatment regimen - Bactrim DS 2 tabs three times daily - is appropriate.  Plan:  1. Continue Bactrim DS 2 tabs po TID for total of 21 days 2. Monitor renal function and potassium levels while on Bactrim  Emeline Gins 11/05/2011,8:31 AM

## 2011-11-06 ENCOUNTER — Other Ambulatory Visit: Payer: Self-pay | Admitting: *Deleted

## 2011-11-06 DIAGNOSIS — B20 Human immunodeficiency virus [HIV] disease: Secondary | ICD-10-CM

## 2011-11-06 LAB — GLUCOSE, CAPILLARY
Glucose-Capillary: 86 mg/dL (ref 70–99)
Glucose-Capillary: 155 mg/dL — ABNORMAL HIGH (ref 70–99)
Glucose-Capillary: 135 mg/dL — ABNORMAL HIGH (ref 70–99)

## 2011-11-06 LAB — BASIC METABOLIC PANEL
BUN: 15 mg/dL (ref 6–23)
Chloride: 96 mEq/L (ref 96–112)
Glucose, Bld: 80 mg/dL (ref 70–99)
Potassium: 4.2 mEq/L (ref 3.5–5.1)

## 2011-11-06 MED ORDER — SULFAMETHOXAZOLE-TMP DS 800-160 MG PO TABS: 2.0000 | ORAL_TABLET | Freq: Three times a day (TID) | ORAL | Status: DC

## 2011-11-06 MED ORDER — PREDNISONE 20 MG PO TABS: 20.0000 mg | ORAL_TABLET | Freq: Every day | ORAL | Status: DC

## 2011-11-06 MED ORDER — AZITHROMYCIN 600 MG PO TABS: 1200.0000 mg | ORAL_TABLET | ORAL | Status: DC

## 2011-11-06 MED ORDER — EFAVIRENZ-EMTRICITAB-TENOFOVIR 600-200-300 MG PO TABS: 1.0000 | ORAL_TABLET | Freq: Every day | ORAL | Status: DC

## 2011-11-06 NOTE — Telephone Encounter (Signed)
hiv med prescriptions have to be handwritten. Unable to print from chart

## 2011-11-06 NOTE — Progress Notes (Signed)
Subjective:      Met with our [facility] nurse yesterday for 1.5 hours to discuss initiation of antiretroviral therapy as an outpatient. Is feeling very well. He has occasional shortness of breath but is able to walk the halls and his room without difficulty.  He denies any fevers or chills or night sweats.  Pt denies chest pain, HA, dizziness, lightheadedness, cough or shortness of breath  Objective: Vital signs in last 24 hours: Filed Vitals:   11/05/11 2100 11/06/11 0500 11/06/11 0900 11/06/11 1335  BP: 106/82 115/74 110/70 116/79  Pulse: 104 83 80 117  Temp: 98.4 F (36.9 C) 96.9 F (36.1 C)  96.6 F (35.9 C)  TempSrc: Oral Oral  Oral  Resp: 20 18  18   Weight:      SpO2: 97% 99%  96%   Weight change:   Intake/Output Summary (Last 24 hours) at 11/06/11 2132 Last data filed at 11/06/11 0815  Gross per 24 hour  Intake      0 ml  Output   1075 ml  Net  -1075 ml   Physical Exam:  General: resting in bed, NAD Cardiac: RRR, no rubs, murmurs or gallops appreciated Pulm: normal work of breathing,  lungs coarse breath sounds bilaterally. No wheezes. Abdomen: Soft, nontender nondistended positive bowel sounds. Extremities: No pedal edema. Neuro: alert and oriented X3, cranial nerves II-XII grossly intact  Lab Results: Basic Metabolic Panel:  Lab 11/06/11 4098 11/05/11 0750  NA 132* 129*  K 4.2 3.9  CL 96 94*  CO2 26 22  GLUCOSE 80 95  BUN 15 17  CREATININE 0.58 0.59  CALCIUM 9.0 9.3  MG -- --  PHOS -- --   A1c = 5.9   OSM Serum   279 [ 275 - 300 mOsm/kg]  OSM Urine   502 [390 - 1090 mOsm/kg] Urine Na: 85  Liver Function Tests:  Lab 11/04/11 0705 11/03/11 0702  AST 78* 83*  ALT 137* 134*  ALKPHOS 114 112  BILITOT 0.2* 0.2*  PROT 7.6 7.7  ALBUMIN 2.0* 1.9*   CBC:  Lab 11/04/11 0705 11/03/11 0702 11/01/11 0630  WBC 6.7 7.5 --  NEUTROABS 4.4 -- 5.3  HGB 11.9* 12.0* --  HCT 35.6* 35.4* --  MCV 90.4 89.6 --  PLT 415* 413* --   CBG:  Lab 11/06/11 1741  11/06/11 1236 11/06/11 0834 11/05/11 2242 11/05/11 1744 11/05/11 1233  GLUCAP 135* 155* 86 105* 208* 141*   Micro Results: Recent Results (from the past 240 hour(s))  AFB CULTURE, BLOOD     Status: Normal (Preliminary result)   Collection Time   10/28/11  5:15 AM      Component Value Range Status Comment   Specimen Description BLOOD LEFT   Final    Special Requests CVC IBV   Final    Culture     Final    Value: CULTURE WILL BE EXAMINED FOR 6 WEEKS BEFORE ISSUING A FINAL REPORT   Report Status PENDING   Incomplete   CRYPTOSPORIDIUM SMEAR, FECAL     Status: Normal   Collection Time   10/29/11  1:10 PM      Component Value Range Status Comment   Specimen Description STOOL   Final    Special Requests NONE   Final    Cryptosporidium Smear.     Final    Value: NO CRYPTOSPORIDIUM SEEN NO ISOSPORA SEEN NO CYCLOSPORA SEEN   Report Status 10/31/2011 FINAL   Final   STOOL CULTURE  Status: Normal   Collection Time   10/29/11  1:10 PM      Component Value Range Status Comment   Specimen Description STOOL   Final    Special Requests NONE   Final    Culture     Final    Value: NO SALMONELLA, SHIGELLA, CAMPYLOBACTER, OR YERSINIA ISOLATED     Note: REDUCED NORMAL FLORA PRESENT   Report Status 11/02/2011 FINAL   Final   CLOSTRIDIUM DIFFICILE BY PCR     Status: Normal   Collection Time   11/02/11 12:55 AM      Component Value Range Status Comment   C difficile by pcr NEGATIVE  NEGATIVE  Final     HIV-1 Western Blot: Positive HIV-2 Ab: Negative HIV: Reactive (A) HIV 1 RNA Quant: 293000 (H)  [<20 copies/mL] HIV1 RNA Quant, Log: 5.47 (H)   Medications: I have reviewed the patient's current medications. Scheduled Meds:    . azithromycin  1,200 mg Oral Weekly  . efavirenz-emtrictabine-tenofovir  1 tablet Oral QHS  . feeding supplement  1 Container Oral TID BM  . folic acid  1 mg Oral Daily  . heparin  5,000 Units Subcutaneous Q8H  . insulin aspart  0-15 Units Subcutaneous TID WC  .  multivitamins ther. w/minerals  1 tablet Oral Daily  . pantoprazole  40 mg Oral Q1200  . predniSONE  40 mg Oral QAC breakfast  . sulfamethoxazole-trimethoprim  2 tablet Oral Q8H  . thiamine  100 mg Oral Daily   Continuous Infusions:  PRN Meds:.sodium chloride  Assessment/Plan: 40 year old Spanish speaking man from [place] originally presenting with fever, cough, shortness of breath, 10 lb weight loss and diarrhea - found to have HIV/AIDS (CD4<10) and PCP. Pt also reports heavy alcohol use.    1) PCP pneumonia: - continue current therapy per ID recommendations  - sulfamethoxazole-trimethoprim (BACTRIM/SEPTRA DS) tablet 800-160 mg 2 tablet, PO, Q8H  (total 21 day therapy / currently DAY 11 of 21) End date 11/25. Will then need one Bactrim double strength tablet daily for prophylaxis prophylaxis until CD4 count is >200.   - predniSONE (DELTASONE) tablet 40 mg, PO, daily (DAY 4 of 5) - then decrease to 20 mg PO daily on 11/08/11  for 11 days (total 21 day therapy to end November 25.) - Weaned from O2 - sating well on NA (97%),  - Pt encouraged to use incentive spirometer every hour - Pt encouraged tocontinue getting up and out of bed   2) Newly diagnosed HIV/AIDS (CD4< 10)  : Pt had never been tested prior to this admission. Pt reports 2-3 sexual encounters since arriving in [place]; denies IV drug use  - VL of 293000  - Still awaiting genotype  - Pt started on Efavirenz-emtrictabine-tenofovir (ATRIPLA) tablet 600-200-300 mg 1 tablet, PO, QHS. Was given one month supply of Atripla today from the [facility] ID clinic supply. - MAC prophylaxis as indicated in pt with CD5<50. Continue Azithromycin (ZITHROMAX) tablet 1,200 mg, PO weekly until CD4>100 for three consecutive months - Bactrim per problem #1             - [facility], and  applying for emergency Medicaid to help his ability to receive and pay for therapy.  3) Hyponatremia -  remains mildly hyponatremic today. sodium 132 today..  According to [employer] - Hyponatremia is fairly common in HIV/AIDS pts (35-55% of hospitalized HIV-infected Pts).  Alternate causes include siADH, volume depletion, adrenal insufficiency (often due  to adrenalitis due to CMV, MAC or HIV) - Also, high dose Bactrim can cause hyponatremia (in addition to hyperkalemia)  - Serum osmoles  are within normal limits, urine osmoles inappropriately high  - This is below the threshold for treatment. But will need adequate followup if discharged  4) Nausea - continue ondansetron (ZOFRAN) IV 4 mg, IV, Q6H PRN for nausea  -- Resolved  5) Diarrhea - Pt reports and episode of diarrhea on evening of 10/29/11. Has not had another episode since then. Stools are now regular. Giardia and cryptosporidium screen were both negative.  -- Resolved -- C.diff negative  6) malnutrition: Given report of >10 lb weight loss in the last month, nutrition consult ordered. -- Resource Breeze by mouth 3 times a day  7) history of heavy EtOH use: Pt had CIWA of 9 at 1601 on 10/31/11 - has be 0 since.  Unlikely to withdraw at this point.  8) DVT prophylaxis: Subcutaneous heparin  9) disposition: We will discharge the patient tomorrow and have him followup in the outpatient clinic on November 25. This is when he discontinues taking prednisone and his Bactrim dose will be decreased to prophylaxis dose only. This way we will be old provide him with his new medication and new instructions and he will have a minimal chance to fall through the cracks.   LOS: 10 days   Nikita Surman 11/06/2011, 9:32 PM

## 2011-11-06 NOTE — Progress Notes (Signed)
Due to language barrier, an interpreter was present during the history-taking and subsequent discussion (and for part of the physical exam) with this patient. Interpreter Wyvonnia Dusky interpteter for Dr Maurice March 16.00hr    11/06/2011

## 2011-11-06 NOTE — Progress Notes (Signed)
Nutrition Follow-up  Diet:  Regular with Resource Breeze , MVI daily. PO's:  100% meal completion Weight:  53.8 kg, trending down. Labs:  Sodium 132 mEq/L L     Potassium 4.2 mEq/L      Chloride 96 mEq/L      CO2 26 mEq/L      Glucose, Bld 80 mg/dL      BUN 15 mg/dL      Creatinine, Ser 0.45 mg/dL      Calcium 9.0 mg/dL      GFR calc non Af Amer >90 mL/min      GFR calc Af Amer >90 mL/min Medications reviewed. RN reports patient is eating well, took the orange flavored Resource Breeze well this morning.   Nutrition Dx:  Increased protein-energy needs, ongoing. Goal:  PO intake to meet at least 90% of estimated needs, met. Plan:  Continue Resource Breeze TID and MVI daily.

## 2011-11-07 ENCOUNTER — Other Ambulatory Visit: Payer: Self-pay | Admitting: Internal Medicine

## 2011-11-07 DIAGNOSIS — B59 Pneumocystosis: Secondary | ICD-10-CM

## 2011-11-07 DIAGNOSIS — B20 Human immunodeficiency virus [HIV] disease: Secondary | ICD-10-CM

## 2011-11-07 DIAGNOSIS — R197 Diarrhea, unspecified: Secondary | ICD-10-CM

## 2011-11-07 LAB — HIV-1 GENOTYPR PLUS: Resistance Assoc RT Mutations: NOT DETECTED

## 2011-11-07 LAB — BASIC METABOLIC PANEL
BUN: 17 mg/dL (ref 6–23)
Calcium: 8.8 mg/dL (ref 8.4–10.5)
Creatinine, Ser: 0.6 mg/dL (ref 0.50–1.35)
GFR calc non Af Amer: >90 mL/min (ref >90)
Glucose, Bld: 81 mg/dL (ref 70–99)

## 2011-11-07 LAB — GLUCOSE, CAPILLARY

## 2011-11-07 MED ORDER — SULFAMETHOXAZOLE-TMP DS 800-160 MG PO TABS: 2.0000 | ORAL_TABLET | Freq: Three times a day (TID) | ORAL | Status: DC

## 2011-11-07 NOTE — Progress Notes (Signed)
Internal Medicine Attending  Date: 11/07/2011  Patient name: Gerald Cox Medical record number: [id number] Date of birth: 07-Jan-1971 Age: 40 y.o. Gender: male  I saw and evaluated the patient. I reviewed the resident's note by Dr. Candy Sledge and I agree with the resident's findings and plans as documented in his note.

## 2011-11-07 NOTE — Discharge Summary (Signed)
Internal Medicine Teaching [facility] Discharge Note  Name: Gerald Cox MRN: [id number] DOB: 1971-02-28 40 y.o.  Date of Admission: 10/27/2011  3:10 AM Date of Discharge: 11/07/2011 Attending Physician: Farley Ly, MD  Discharge Diagnosis: Principal Problem:  *Pneumocystis jiroveci pneumonia Active Problems:  HIV (human immunodeficiency virus infection)  Hyponatremia  Diarrhea   Discharge Medications: Current Discharge Medication List    CONTINUE these medications which have NOT CHANGED   Details  azithromycin (ZITHROMAX) 600 MG tablet Take 2 tablets (1,200 mg total) by mouth every 7 (seven) days. Qty: 10 tablet, Refills: 3   Associated Diagnoses: Human immunodeficiency virus (HIV) disease    efavirenz-emtrictabine-tenofovir (ATRIPLA) 600-200-300 MG per tablet Take 1 tablet by mouth at bedtime. Qty: 30 tablet, Refills: 3   Associated Diagnoses: Human immunodeficiency virus (HIV) disease    predniSONE (DELTASONE) 20 MG tablet Take 1 tablet (20 mg total) by mouth daily. X 10 days  Qty: 10 tablet, Refills: 0   Associated Diagnoses: Human immunodeficiency virus (HIV) disease    sulfamethoxazole-trimethoprim (BACTRIM DS) 800-160 MG per tablet Take 2 tablets by mouth 3 (three) times daily. X 10 days  Qty: 60 tablet, Refills: 0   Associated Diagnoses: Human immunodeficiency virus (HIV) disease      STOP taking these medications     ciprofloxacin (CIPRO) 500 MG tablet      loperamide (IMODIUM) 2 MG capsule      metroNIDAZOLE (FLAGYL) 250 MG tablet      nystatin (MYCOSTATIN) 100000 UNIT/ML suspension      ranitidine (ZANTAC) 150 MG tablet         Disposition and follow-up:   Mr.Brenen Doval-Rivera was discharged from [facility] in Good condition.   At his followup appointment with the excellent Dr. Laurel Dimmer the following item should be addressed: #1 please inform the patient that as of November 26 he should take Bactrim DS only  once daily for PJP prophylaxis #2 please ensure the patient has continued to take azithromycin every Friday for MAC prophylaxis #3 please ensure the patient has discontinued prednisone his last date should have been November 25 #4 please check BMP as the patient was mildly hyponatremic during his admission  Follow-up Appointments: Follow-up Information    Follow up with SIDHU,AMANJOT on 11/17/2011. (at 3:45 PM )    Contact information:   [address] [place] [address] [phone]       Follow up with Judyann Munson, MD on 12/03/2011.   Contact information:   [address] [place] [address] [place] [address] [phone]         Discharge Orders    Future Appointments: Provider: Department: Dept Phone: Center:   11/17/2011 3:45 PM Amanjot Sidhu [facility] [phone] [facility]   12/03/2011 3:15 PM Judyann Munson, MD [facility] [phone] [facility]     Future Orders Please Complete By Expires   Diet - low sodium heart healthy      Increase activity slowly      Call MD for:  temperature >100.4      Call MD for:  persistant nausea and vomiting      Call MD for:  severe uncontrolled pain      Call MD for:  difficulty breathing, headache or visual disturbances      Call MD for:  persistant dizziness or light-headedness      Call MD for:  extreme fatigue         Consultations:  Infectious disease  Judyann Munson, MD  Procedures Performed:   Bronchoscopy with brushings  10/27/2011. Evaluation  Hemodynamic Status: BP stable throughout; O2 sats: stable throughout  Specimens: Sent serosanguinous fluid  Complications: No apparent complications.  Patient did tolerate procedure well.  Findings:  1. No no endobronhial lesions  2. No secretions at all  3. tolearted RML, lingula BAl , 50% yield slight cloudty  4. No KS lesions    Dg Chest Portable 1 View 10/28/2011  *RADIOLOGY REPORT*  Clinical Data: Evaluate ET tube placement   PORTABLE CHEST - 1 VIEW  Comparison: 10/27/2011  Findings:  The ET tube tip is above the carina.  There is a left IJ catheter with tip in the SVC.  The heart size appears normal.  Diffuse bilateral airspace opacities are again noted and appear unchanged from previous exam.  IMPRESSION:  1.  No change in bilateral airspace opacities compared with previous exam.  Original Report Authenticated By: Rosealee Albee, M.D.   Dg Chest Portable 1 View 10/27/2011  *RADIOLOGY REPORT*  Clinical Data: Respiratory distress, evaluate endotracheal tube position  PORTABLE CHEST - 1 VIEW  Comparison: Portable chest x-ray of 10/27/2011  Findings: The tip of the endotracheal tube is approximately 3.1 cm above the carina.  There is been significant worsening of diffuse airspace disease.  This pattern most likely represents edema although superimposed pneumonia cannot be excluded.  Mild cardiomegaly is stable.  Left IJ central venous line is present with the tip in the mid SVC and no pneumothorax is seen.  IMPRESSION:  1.  Tip of endotracheal tube 3.1 cm above the carina. 2.  Significant worsening of diffuse airspace disease. 3.  Left IJ central venous line tip in mid SVC.  Original Report Authenticated By: Juline Patch, M.D.   Dg Chest Portable 1 View 10/27/2011  *RADIOLOGY REPORT*  Clinical Data: Low O2 saturation, shortness of breath.  PORTABLE CHEST - 1 VIEW  Comparison: None.  Findings: Patchy airspace opacity of the left greater than right lungs. Central vascular congestion.  Cardiomediastinal contours are within upper normal limits otherwise. No pleural effusion or pneumothorax.  No acute osseous abnormality.  IMPRESSION: Left greater than right airspace opacity; asymmetric edema versus infection.  Original Report Authenticated By: Waneta Martins, M.D.   Dg Abd Portable 1v 10/28/2011  *RADIOLOGY REPORT*  Clinical Data: Panda tube placement.  ABDOMEN - 1 VIEW  Comparison: Chest radiographs same date.  Findings: 1540  hours.  Feeding tube projects into the right mid abdomen, likely at the gastric pylorus or duodenal bulb.  The visualized bowel gas pattern is normal.  There is mild motion artifact.  IMPRESSION: Panda tube tip in the distal stomach or proximal duodenum.  Original Report Authenticated By: Gerrianne Scale, M.D.   Admission HPI: 40 year old man presented to the emergency department with complaint of shortness of breath and fever for 2-3 days.  Patient reported feeling unwell for the last 2 months with constant diarrhea, abdominal pain anorexia weight loss and night sweats. Patient had been seen at an urgent care and started on Cipro, Flagyl, Imodium and nystatin suspension.   Admission Physical Examination  BP 109/63  Pulse 90  Temp(Src) 97.8 F (36.6 C) (Oral)  Resp 20  SpO2 95%  Neuro: Sedated, intubated , int aggitation  HEENT: ETT ,. No jvd, after 7 liters  Heart: Rrt s1 s 2 , no m  Lungs: Bilateral air entry, coarse  Abdomen: Soft, non tender, bowel sounds present  Extremities:  Bowel sounds present   Admission Labs  Results for orders placed during the hospital encounter of 10/27/11 (from the past 24 hour(s))   CBC Status: Abnormal    Collection Time    10/27/11 3:45 AM   Component  Value  Range    WBC  7.0  4.0 - 10.5 (K/uL)    RBC  3.69 (*)  4.22 - 5.81 (MIL/uL)    Hemoglobin  11.5 (*)  13.0 - 17.0 (g/dL)    HCT  04.5 (*)  40.9 - 52.0 (%)    MCV  86.7  78.0 - 100.0 (fL)    MCH  31.2  26.0 - 34.0 (pg)    MCHC  35.9  30.0 - 36.0 (g/dL)    RDW  81.1  91.4 - 78.2 (%)    Platelets  354  150 - 400 (K/uL)   DIFFERENTIAL Status: Abnormal    Collection Time    10/27/11 3:45 AM   Component  Value  Range    Neutrophils Relative  88 (*)  43 - 77 (%)    Neutro Abs  6.1  1.7 - 7.7 (K/uL)    Lymphocytes Relative  10 (*)  12 - 46 (%)    Lymphs Abs  0.7  0.7 - 4.0 (K/uL)    Monocytes Relative  3  3 - 12 (%)    Monocytes Absolute  0.2  0.1 - 1.0 (K/uL)    Eosinophils Relative  0  0 - 5  (%)    Eosinophils Absolute  0.0  0.0 - 0.7 (K/uL)    Basophils Relative  0  0 - 1 (%)    Basophils Absolute  0.0  0.0 - 0.1 (K/uL)   COMPREHENSIVE METABOLIC PANEL Status: Abnormal    Collection Time    10/27/11 3:45 AM   Component  Value  Range    Sodium  125 (*)  135 - 145 (mEq/L)    Potassium  4.4  3.5 - 5.1 (mEq/L)    Chloride  91 (*)  96 - 112 (mEq/L)    CO2  22  19 - 32 (mEq/L)    Glucose, Bld  100 (*)  70 - 99 (mg/dL)    BUN  16  6 - 23 (mg/dL)    Creatinine, Ser  9.56  0.50 - 1.35 (mg/dL)    Calcium  8.0 (*)  8.4 - 10.5 (mg/dL)    Total Protein  7.7  6.0 - 8.3 (g/dL)    Albumin  1.6 (*)  3.5 - 5.2 (g/dL)    AST  94 (*)  0 - 37 (U/L)    ALT  21  0 - 53 (U/L)    Alkaline Phosphatase  60  39 - 117 (U/L)    Total Bilirubin  0.3  0.3 - 1.2 (mg/dL)    GFR calc non Af Amer  >90  >90 (mL/min)        LACTIC ACID, PLASMA Status: Normal    Collection Time    10/27/11 4:20 AM   Component  Value  Range    Lactic Acid, Venous  1.1  0.5 - 2.2 (mmol/L)   CARDIAC PANEL(CRET KIN+CKTOT+MB+TROPI) Status: Abnormal    Collection Time    10/27/11 4:34 AM   Component  Value  Range    Total CK  198  7 - 232 (U/L)    CK, MB  2.9  0.3 - 4.0 (ng/mL)    Troponin I  0.63 (*)  <0.30 (ng/mL)  Relative Index  1.5  0.0 - 2.5   POCT I-STAT 3, BLOOD GAS (G3+) Status: Abnormal    Collection Time    10/27/11 6:07 AM   Component  Value  Range    pH, Arterial  7.364  7.350 - 7.450    pCO2 arterial  31.5 (*)  35.0 - 45.0 (mmHg)    pO2, Arterial  85.0  80.0 - 100.0 (mmHg)    Bicarbonate  18.0 (*)  20.0 - 24.0 (mEq/L)    TCO2  19  0 - 100 (mmol/L)    O2 Saturation  96.0     Acid-base deficit  7.0 (*)  0.0 - 2.0 (mmol/L)    Patient temperature  97.8 F     Collection site  RADIAL, ALLEN'S TEST ACCEPTABLE     Drawn by  RT     Sample type  ARTERIAL    URINALYSIS, ROUTINE W REFLEX MICROSCOPIC Status: Abnormal    Collection Time    10/27/11 6:20 AM   Component  Value  Range    Color, Urine  YELLOW   YELLOW    Appearance  CLEAR  CLEAR    Specific Gravity, Urine  1.011  1.005 - 1.030    pH  6.0  5.0 - 8.0    Glucose, UA  NEGATIVE  NEGATIVE (mg/dL)    Hgb urine dipstick  NEGATIVE  NEGATIVE    Bilirubin Urine  NEGATIVE  NEGATIVE    Ketones, ur  NEGATIVE  NEGATIVE (mg/dL)    Protein, ur  30 (*)  NEGATIVE (mg/dL)    Urobilinogen, UA  0.2  0.0 - 1.0 (mg/dL)    Nitrite  NEGATIVE  NEGATIVE    Leukocytes, UA  NEGATIVE  NEGATIVE     Hospital Course by problem list:   #1 PJP pneumonia with respiratory failure - Mr. Sperbeck presented on 10/27/11 to [facility] Emergency Department in severe respiratory distress.  He reported 1 month history of shortness of breath and cough with accompanied fever/chills/nightsweats/anorexia.  In the ED, Pt was noted to be febrile (T to 102F), tachycardic (HR 100s), tachypneic (RR in the 40s) and hypoxic (O2 sats in 70% range).  Pt was intubated, placed on vasopressors and admitted to ICU. Pt underwent bronchoscopy - specimen sent for aerobic, fungal, AFB cultures and pneumocystis stain.  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 He was found to be HIV positive. With CD4 count less than 10. Pt was weaned off pressors, extubated on 10/29/11 and transferred to the Internal Medicine Teaching Service for the remainder of his stay.  Pt status improved dramatically, and he was weaned off oxygen. He is to continue Bactrim DS 3 times daily until November 25. He is then to take Bactrim DS daily until CD4 count is greater than 200 for PJP prophylaxis.  #2 HIV (human immunodeficiency virus infection) - Mr. Prinsen received a new diagnosis of HIV/AIDS during this hospitalization. CD4 count was  less than 10 and viral load was 293000.  The results of the genotype were pending at the time of discharge. The pt's prolonged malaise/fever was suspicious for a presentation of disseminated MAC. Mycobacterial blood cultures were sent for analysis - results are pending. Pt is also  in contact with [facility] and has been assigned a medical case Production designer, theatre/television/film.  He was placed on Bactrim for PJP pneumonia and azithromycin weekly for MAC prophylaxis. Mr. Devincenzi was started on Atripla. And was given one month supply of atripla. He was also given a pill box and extensive instruction on how to take his medications. This was necessary because Mr. Prochnow does not speak English. And he can only read a very small amount in [place]. His family members were present to help him take his medications correctly. During his hospitalization a Scientist, clinical (histocompatibility and immunogenetics) visited Mr. Gelles to help him understand the services offered at the HIV clinic. Our social worker began the process of applying for emergency Medicaid in order to help Mr. Doval-Rivera pay for his medications and hospitalization. Pt is also in contact with [facility] and has been assigned a medical case manager.   #3 Hyponatremia -Mr. Doval-Rivera 's sodium ranged from 129 -132.  A distinct cause for his hyponatremia was not found however it was not deemed to be severe enough to require correction. Mr. Sonda Primes was aggressively volume resuscitated with normal saline. Potential causes are high dose Bactrim and Hyponatremia is fairly common in HIV/AIDS pts (35-55% of hospitalized HIV-infected Pts).- Serum osmoles were within normal limits, urine osmoles inappropriately high   #4 Diarrhea - Mr. Varden reported episodes of diarrhea. Stool studies were sent for Giardia and cryptosporidium which were both negative. C. difficile PCR was negative. Diarrhea rapidly resolved without treatment.  #5 Elevated blood glucose -Mr.  Mr. Godown  had elevated CBGs during his admission. His A1c was . As he was taking high dose prednisone as elevated blood glucose likely does not reflect diabetes. However after the steroids have been discontinued a followup CBG is warranted.  Discharge Vitals:  BP 105/67  Pulse 94  Temp(Src) 97.7 F (36.5 C) (Oral)  Resp 18  Wt 118 lb 9.7 oz (53.8 kg)  SpO2 96%  Discharge Labs:  Results for orders placed during the hospital encounter of 10/27/11 (from the past 24 hour(s))  GLUCOSE, CAPILLARY     Status: Abnormal   Collection Time   11/06/11 12:36 PM      Component Value Range   Glucose-Capillary 155 (*) 70 - 99 (mg/dL)  GLUCOSE, CAPILLARY     Status: Abnormal   Collection Time   11/06/11  5:41 PM      Component Value Range   Glucose-Capillary 135 (*) 70 - 99 (mg/dL)   Comment 1 Notify RN     Comment 2 Documented in Chart    GLUCOSE, CAPILLARY     Status: Abnormal   Collection Time   11/06/11 10:37 PM      Component Value Range   Glucose-Capillary 129 (*) 70 - 99 (mg/dL)  BASIC METABOLIC PANEL     Status: Abnormal   Collection Time   11/07/11  6:08 AM      Component Value Range   Sodium 132 (*) 135 - 145 (mEq/L)   Potassium 3.9  3.5 - 5.1 (mEq/L)   Chloride 97  96 - 112 (mEq/L)   CO2 26  19 - 32 (mEq/L)   Glucose, Bld 81  70 - 99 (mg/dL)   BUN 17  6 - 23 (mg/dL)   Creatinine, Ser 4.09  0.50 - 1.35 (mg/dL)   Calcium 8.8  8.4 - 81.1 (mg/dL)   GFR calc non Af Amer >90  >90 (mL/min)   GFR calc Af Amer >90  >90 (  mL/min)  GLUCOSE, CAPILLARY     Status: Normal   Collection Time   11/07/11  8:24 AM      Component Value Range   Glucose-Capillary 80  70 - 99 (mg/dL)   Comment 1 Notify RN     Comment 2 Documented in Chart      Signed: RAISCH, MICHAEL 11/07/2011, 12:05 PM

## 2011-11-07 NOTE — Discharge Planning (Signed)
Patient discharged home in stable condition. Verbalizes understanding of all discharge instructions, including home medications and follow up appointments. Instructions discussed with patient by Spanish-speaking MD.

## 2011-11-07 NOTE — Progress Notes (Signed)
Subjective:      Met with our [facility] nurse yesterday for 1.5 hours to discuss initiation of antiretroviral therapy as an outpatient. Is feeling very well. He has occasional shortness of breath but is able to walk the halls and his room without difficulty.  He denies any fevers or chills or night sweats.  Pt denies chest pain, HA, dizziness, lightheadedness, cough or shortness of breath  Objective: Vital signs in last 24 hours: Filed Vitals:   11/06/11 0900 11/06/11 1335 11/06/11 2100 11/07/11 0500  BP: 110/70 116/79 107/66 105/67  Pulse: 80 117 108 94  Temp:  96.6 F (35.9 C) 97.8 F (36.6 C) 97.7 F (36.5 C)  TempSrc:  Oral Oral Oral  Resp:  18 18 18   Weight:      SpO2:  96% 96% 96%   Weight change:   Intake/Output Summary (Last 24 hours) at 11/07/11 0848 Last data filed at 11/07/11 0645  Gross per 24 hour  Intake    222 ml  Output   1075 ml  Net   -853 ml   Physical Exam:  General: resting in bed, NAD Cardiac: RRR, no rubs, murmurs or gallops appreciated Pulm: normal work of breathing,  CTAB No wheezes. Abdomen: Soft, nontender nondistended positive bowel sounds. Extremities: No pedal edema. Neuro: alert and oriented X3, cranial nerves II-XII grossly intact  Lab Results: Basic Metabolic Panel:  Lab 11/07/11 1610 11/06/11 0635  NA 132* 132*  K 3.9 4.2  CL 97 96  CO2 26 26  GLUCOSE 81 80  BUN 17 15  CREATININE 0.60 0.58  CALCIUM 8.8 9.0  MG -- --  PHOS -- --   A1c = 5.9   OSM Serum   279 [ 275 - 300 mOsm/kg]  OSM Urine   502 [390 - 1090 mOsm/kg] Urine Na: 85  Liver Function Tests:  Lab 11/04/11 0705 11/03/11 0702  AST 78* 83*  ALT 137* 134*  ALKPHOS 114 112  BILITOT 0.2* 0.2*  PROT 7.6 7.7  ALBUMIN 2.0* 1.9*   CBC:  Lab 11/04/11 0705 11/03/11 0702 11/01/11 0630  WBC 6.7 7.5 --  NEUTROABS 4.4 -- 5.3  HGB 11.9* 12.0* --  HCT 35.6* 35.4* --  MCV 90.4 89.6 --  PLT 415* 413* --   CBG:  Lab 11/07/11 0824 11/06/11 2237 11/06/11 1741 11/06/11  1236 11/06/11 0834 11/05/11 2242  GLUCAP 80 129* 135* 155* 86 105*   Micro Results: Recent Results (from the past 240 hour(s))  CRYPTOSPORIDIUM SMEAR, FECAL     Status: Normal   Collection Time   10/29/11  1:10 PM      Component Value Range Status Comment   Specimen Description STOOL   Final    Special Requests NONE   Final    Cryptosporidium Smear.     Final    Value: NO CRYPTOSPORIDIUM SEEN NO ISOSPORA SEEN NO CYCLOSPORA SEEN   Report Status 10/31/2011 FINAL   Final   STOOL CULTURE     Status: Normal   Collection Time   10/29/11  1:10 PM      Component Value Range Status Comment   Specimen Description STOOL   Final    Special Requests NONE   Final    Culture     Final    Value: NO SALMONELLA, SHIGELLA, CAMPYLOBACTER, OR YERSINIA ISOLATED     Note: REDUCED NORMAL FLORA PRESENT   Report Status 11/02/2011 FINAL   Final   CLOSTRIDIUM DIFFICILE BY PCR  Status: Normal   Collection Time   11/02/11 12:55 AM      Component Value Range Status Comment   C difficile by pcr NEGATIVE  NEGATIVE  Final     HIV-1 Western Blot: Positive HIV-2 Ab: Negative HIV: Reactive (A) HIV 1 RNA Quant: 293000 (H)  [<20 copies/mL] HIV1 RNA Quant, Log: 5.47 (H)   Medications: I have reviewed the patient's current medications. Scheduled Meds:    . azithromycin  1,200 mg Oral Weekly  . efavirenz-emtrictabine-tenofovir  1 tablet Oral QHS  . feeding supplement  1 Container Oral TID BM  . folic acid  1 mg Oral Daily  . heparin  5,000 Units Subcutaneous Q8H  . insulin aspart  0-15 Units Subcutaneous TID WC  . multivitamins ther. w/minerals  1 tablet Oral Daily  . pantoprazole  40 mg Oral Q1200  . predniSONE  40 mg Oral QAC breakfast  . sulfamethoxazole-trimethoprim  2 tablet Oral Q8H  . thiamine  100 mg Oral Daily   Continuous Infusions:  PRN Meds:.sodium chloride  Assessment/Plan: 40 year old Spanish speaking man from [place] originally presenting with fever, cough, shortness of breath, 10 lb  weight loss and diarrhea - found to have HIV/AIDS (CD4<10) and PCP. Pt also reports heavy alcohol use.    1) PCP pneumonia: - continue current therapy per ID recommendations  - sulfamethoxazole-trimethoprim (BACTRIM/SEPTRA DS) tablet 800-160 mg 2 tablet, PO, Q8H  (total 21 day therapy / currently DAY 12 of 21) End date 11/25. Will then need one Bactrim double strength tablet daily for prophylaxis prophylaxis until CD4 count is >200.   - predniSONE (DELTASONE) tablet 40 mg, PO, daily (DAY 5 of 5) - then decrease to 20 mg PO daily on 11/08/11  for 11 days (total 21 day therapy to end November 25.) - Weaned from O2 - sating well on NA (97%),  - Pt encouraged to use incentive spirometer every hour - Pt encouraged to continue getting up and out of bed   2) Newly diagnosed HIV/AIDS (CD4< 10)  : Pt had never been tested prior to this admission. Pt reports 2-3 sexual encounters since arriving in [place]; denies IV drug use  - VL of 293000  - Still awaiting genotype  - Pt started on Efavirenz-emtrictabine-tenofovir (ATRIPLA) tablet 600-200-300 mg 1 tablet, PO, QHS. Was given one month supply of Atripla today from the [facility] ID clinic supply. - MAC prophylaxis as indicated in pt with CD5<50. Continue Azithromycin (ZITHROMAX) tablet 1,200 mg, PO weekly until CD4>100 for three consecutive months - Bactrim per problem #1             - Getting set up in the [facility], and  applying for emergency Medicaid to help his ability to receive and pay for therapy.  3) Hyponatremia -  remains mildly hyponatremic today. sodium 132 today.. According to Upto[employer] - Hyponatremia is fairly common in HIV/AIDS pts (35-55% of hospitalized HIV-infected Pts).  Alternate causes include siADH, volume depletion, adrenal insufficiency (often due to adrenalitis due to CMV, MAC or HIV) - Also, high dose Bactrim can cause hyponatremia (in addition to hyperkalemia)  - Serum osmoles  are within normal limits, urine osmoles inappropriately high  - This  is below the threshold for treatment. But will need adequate followup if discharged  4) Nausea - continue ondansetron (ZOFRAN) IV 4 mg, IV, Q6H PRN for nausea  -- Resolved  5) Diarrhea - Pt reports and episode of diarrhea on evening of 10/29/11. Has not had another episode  since then. Stools are now regular. Giardia and cryptosporidium screen were both negative.  -- Resolved -- C.diff negative  6) malnutrition: Given report of >10 lb weight loss in the last month, nutrition consult ordered. -- Resource Breeze by mouth 3 times a day  7) history of heavy EtOH use: Pt had CIWA of 9 at 1601 on 10/31/11 - has be 0 since.  Unlikely to withdraw at this point.  8) DVT prophylaxis: Subcutaneous heparin  9) disposition: We will discharge the patient today and have him followup in the outpatient clinic on Monday November 26. This is the day after he discontinues taking prednisone and when his Bactrim dose will be decreased to prophylaxis dose only. This way we will be old provide him with his new medication and new instructions and he will have a minimal chance to fall through the cracks.   LOS: 11 days   Oanh Devivo 11/07/2011, 8:48 AM

## 2011-11-08 MED ORDER — SULFAMETHOXAZOLE-TMP DS 800-160 MG PO TABS: 2.0000 | ORAL_TABLET | Freq: Three times a day (TID) | ORAL | Status: DC

## 2011-11-10 NOTE — Progress Notes (Signed)
Clinical Social Work-CSW confirmed THP and interpreter able to meet with pt to arrange after care-pt d/c over weekend-no further CSW needs-Sign off-Lelah Rennaker-MSW, [phone]

## 2011-11-17 ENCOUNTER — Encounter: Payer: Self-pay | Admitting: Internal Medicine

## 2011-11-17 ENCOUNTER — Ambulatory Visit (INDEPENDENT_AMBULATORY_CARE_PROVIDER_SITE_OTHER): Payer: Self-pay | Admitting: Internal Medicine

## 2011-11-17 VITALS — BP 121/82 | HR 120 | Temp 98.8°F | Resp 20 | Ht 64.75 in | Wt 149.7 lb

## 2011-11-17 DIAGNOSIS — B370 Candidal stomatitis: Secondary | ICD-10-CM

## 2011-11-17 DIAGNOSIS — B59 Pneumocystosis: Secondary | ICD-10-CM

## 2011-11-17 DIAGNOSIS — B20 Human immunodeficiency virus [HIV] disease: Secondary | ICD-10-CM

## 2011-11-17 DIAGNOSIS — E871 Hypo-osmolality and hyponatremia: Secondary | ICD-10-CM

## 2011-11-17 LAB — BASIC METABOLIC PANEL
Chloride: 102 mEq/L (ref 96–112)
Creat: 0.54 mg/dL (ref 0.50–1.35)
Potassium: 4.7 mEq/L (ref 3.5–5.3)

## 2011-11-17 MED ORDER — SULFAMETHOXAZOLE-TMP DS 800-160 MG PO TABS: 1.0000 | ORAL_TABLET | Freq: Every day | ORAL | Status: DC

## 2011-11-17 MED ORDER — FLUCONAZOLE 100 MG PO TABS: 100.0000 mg | ORAL_TABLET | Freq: Every day | ORAL | Status: DC

## 2011-11-17 NOTE — Assessment & Plan Note (Signed)
Improved. Transition Bactrim to once a day for PCP prophylaxis.

## 2011-11-17 NOTE — Assessment & Plan Note (Signed)
Compliant with efavirenz. Has appt with Dr. Drue Second in a couple of weeks.  Continue bactrim once daily for PCP prophylaxis. Continue azithromycin once weekly for MAC prophylaxis. Fluconazole for thrush taken once daily.

## 2011-11-17 NOTE — Patient Instructions (Addendum)
Please start taking Bactrim (sulfamethoxazole-trimethoprim) only once a day as you were previously taking it 3 times a day. Please stop taking Prednisone. Please take Azithromycin once a week. Please take Efavirenz as directed for HIV.  Please follow up with infectious disease doctor as needed for further management of HIV. Please take Fluconazole for thrush in your mouth.

## 2011-11-17 NOTE — Assessment & Plan Note (Signed)
BMET    Component Value Date/Time   NA 132* 11/07/2011 0608   K 3.9 11/07/2011 0608   CL 97 11/07/2011 0608   CO2 26 11/07/2011 0608   GLUCOSE 81 11/07/2011 0608   BUN 17 11/07/2011 0608   CREATININE 0.60 11/07/2011 0608   CALCIUM 8.8 11/07/2011 0608   GFRNONAA >90 11/07/2011 0608   GFRAA >90 11/07/2011 0608    Slightly hyponatremic at discharge likely secondary to PCP. Will check a repeat BMET today to follow up.

## 2011-11-17 NOTE — Assessment & Plan Note (Signed)
We'll prescribe fluconazole 100 mg to be taken daily. Patient will have this reevaluated at his infectious disease appointment with Dr. Drue Second.

## 2011-11-17 NOTE — Progress Notes (Signed)
  Subjective:    Patient ID: Gerald Cox, male    DOB: Apr 30, 1971, 40 y.o.   MRN: [id number]  HPI  Gerald Cox is a 40 year old man with pmh significant for HIV presents to the clinic for hospital follow up. Patient was admitted for PCP and was discharged on bactrim, and prednisone, along with efavirenz and azithro for mac prophylaxis.   The patient states that since discharge he has been feeling better but does complain of some difficulty swallowing and a weird sensation over his time. He also complains that occasionally he will feel short of breath. No wheezing. No fevers or chills. Patient is compliant with medications. He states he has run out of one medication which consists of a small pink pill that he takes in the evening. Description seems consistent with prednisone which he was supposed to have finished.  Review of Systems  All other systems reviewed and are negative.       Objective:   Physical Exam  Constitutional: He appears well-developed.  HENT:  Head: Normocephalic.       Oral thrush present  Neck: Normal range of motion.  Cardiovascular: Normal rate.   Pulmonary/Chest: He has no wheezes. Tenderness: shallow breath sounds otherwise normal effort.  Abdominal: Soft.  Musculoskeletal: Normal range of motion.  Skin: Skin is warm and dry.          Assessment & Plan:

## 2011-11-18 NOTE — Progress Notes (Signed)
E-prescribe was done yesterday; Bactrim DS rx faxed to [facility] pharmacy.

## 2011-11-24 LAB — FUNGUS CULTURE W SMEAR

## 2011-11-27 ENCOUNTER — Other Ambulatory Visit: Payer: Self-pay | Admitting: *Deleted

## 2011-11-27 ENCOUNTER — Other Ambulatory Visit: Payer: Self-pay | Admitting: Internal Medicine

## 2011-11-27 DIAGNOSIS — B20 Human immunodeficiency virus [HIV] disease: Secondary | ICD-10-CM

## 2011-11-27 DIAGNOSIS — B370 Candidal stomatitis: Secondary | ICD-10-CM

## 2011-11-27 MED ORDER — FLUCONAZOLE 100 MG PO TABS: 100.0000 mg | ORAL_TABLET | Freq: Every day | ORAL | Status: DC

## 2011-11-27 MED ORDER — SULFAMETHOXAZOLE-TMP DS 800-160 MG PO TABS: 1.0000 | ORAL_TABLET | Freq: Every day | ORAL | Status: DC

## 2011-11-27 MED ORDER — AZITHROMYCIN 600 MG PO TABS: 1200.0000 mg | ORAL_TABLET | ORAL | Status: DC

## 2011-11-27 MED ORDER — EFAVIRENZ-EMTRICITAB-TENOFOVIR 600-200-300 MG PO TABS: 1.0000 | ORAL_TABLET | Freq: Every day | ORAL | Status: DC

## 2011-11-27 NOTE — Telephone Encounter (Signed)
Reprint Dr Drue Second can not print at this time.

## 2011-12-03 ENCOUNTER — Other Ambulatory Visit: Payer: Self-pay | Admitting: *Deleted

## 2011-12-03 ENCOUNTER — Ambulatory Visit (INDEPENDENT_AMBULATORY_CARE_PROVIDER_SITE_OTHER): Payer: Self-pay | Admitting: Internal Medicine

## 2011-12-03 ENCOUNTER — Encounter: Payer: Self-pay | Admitting: Internal Medicine

## 2011-12-03 VITALS — BP 126/85 | HR 112 | Temp 97.8°F | Wt 151.0 lb

## 2011-12-03 DIAGNOSIS — B370 Candidal stomatitis: Secondary | ICD-10-CM

## 2011-12-03 DIAGNOSIS — B20 Human immunodeficiency virus [HIV] disease: Secondary | ICD-10-CM

## 2011-12-03 DIAGNOSIS — A6000 Herpesviral infection of urogenital system, unspecified: Secondary | ICD-10-CM | POA: Insufficient documentation

## 2011-12-03 DIAGNOSIS — Z21 Asymptomatic human immunodeficiency virus [HIV] infection status: Secondary | ICD-10-CM

## 2011-12-03 MED ORDER — FLUCONAZOLE 100 MG PO TABS: 100.0000 mg | ORAL_TABLET | Freq: Every day | ORAL | Status: DC

## 2011-12-03 MED ORDER — AZITHROMYCIN 600 MG PO TABS: 1200.0000 mg | ORAL_TABLET | ORAL | Status: DC

## 2011-12-03 MED ORDER — EFAVIRENZ-EMTRICITAB-TENOFOVIR 600-200-300 MG PO TABS: 1.0000 | ORAL_TABLET | Freq: Every day | ORAL | Status: DC

## 2011-12-03 MED ORDER — SULFAMETHOXAZOLE-TMP DS 800-160 MG PO TABS: 1.0000 | ORAL_TABLET | Freq: Every day | ORAL | Status: DC

## 2011-12-03 MED ORDER — VALACYCLOVIR HCL 1 G PO TABS: 1000.0000 mg | ORAL_TABLET | Freq: Every day | ORAL | Status: DC

## 2011-12-03 NOTE — Progress Notes (Signed)
Subjective:    Patient ID: Gerald Cox, male    DOB: 1971-09-12, 40 y.o.   MRN: [id number]  HPI Gerald Cox is 40yo spanish speaking male originally from [place], immigrated to [place] 6 yrs ago. Diagnosed with HIV/AIDS, CD 4 count <10 (1%)/ VL 293,000 in November 2012. He presented with Acute DOE, admitted for acute respiratory failure 2/2 PCP pna. He was admitted on 11/5 discharged on 11/16, started on Atripla and taking bactrim, azithro for prophylaxis. He was given course of therapy for fluconazole for thrush. Since being discharged he has gained weight, #20 back up to his baseline of 151 lbs. He is feeling much better, he still has some shortness of breath with exertion. He denies missing any doses of his medicines. He only has 5 days of atripla left. He has finished his ADAP application and pharmacy is to deliver his medications. He states that roughly 1 week ago, he noticed pimples on his penis which come and go every 3 months. They are improving per the patient.  This interview conducted through phone translator  He denies fever, chills, nightsweats, cough, diarrhea, dysuria, penile discharge  Active Ambulatory Problems    Diagnosis Date Noted  . HIV (human immunodeficiency virus infection) 10/27/2011  . Hyponatremia 10/27/2011  . 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 Diarrhea 11/07/2011  . Thrush 11/17/2011   Resolved Ambulatory Problems    Diagnosis Date Noted  . Sepsis associated hypotension 10/27/2011  . Acute respiratory failure 10/27/2011  . Septic shock 10/27/2011  . ARDS (adult respiratory distress syndrome) 10/27/2011   Past Medical History  Diagnosis Date  . Typhoid fever   . Laceration of hand with tendon involvement 2002  . Angina   . Pneumonia    Current Outpatient Prescriptions  Medication Sig Dispense Refill  . azithromycin (ZITHROMAX) 600 MG tablet Take 2 tablets (1,200 mg total) by mouth every 7 (seven) days.  10 tablet  5  .  efavirenz-emtrictabine-tenofovir (ATRIPLA) 600-200-300 MG per tablet Take 1 tablet by mouth at bedtime.  30 tablet  5  . fluconazole (DIFLUCAN) 100 MG tablet Take 1 tablet (100 mg total) by mouth daily.  30 tablet  5  . sulfamethoxazole-trimethoprim (BACTRIM DS) 800-160 MG per tablet Take 1 tablet by mouth daily.  30 tablet  5  . valACYclovir (VALTREX) 1000 MG tablet Take 1 tablet (1,000 mg total) by mouth daily.  30 tablet  11    Review of Systems Review of Systems  Constitutional: Negative for fever, chills, diaphoresis, activity change, appetite change, fatigue and unexpected weight change.  HENT: Negative for congestion, sore throat, rhinorrhea, sneezing, trouble swallowing and sinus pressure.  Eyes: Negative for photophobia and visual disturbance.  Respiratory: Negative for cough, chest tightness, wheezing and stridor. + occasional shortness of breath but improving overall since being discharge from hospital Cardiovascular: Negative for chest pain, palpitations and leg swelling.  Gastrointestinal: Negative for nausea, vomiting, abdominal pain, diarrhea, constipation, blood in stool, abdominal distention and anal bleeding.  Genitourinary: Negative for dysuria, hematuria, flank pain and difficulty urinating.  Musculoskeletal: Negative for myalgias, back pain, joint swelling, arthralgias and gait problem.  Skin: Negative for color change, pallor, rash and wound.  Neurological: Negative for dizziness, tremors, weakness and light-headedness.  Hematological: Negative for adenopathy. Does not bruise/bleed easily.  Psychiatric/Behavioral: Negative for behavioral problems, confusion, sleep disturbance, dysphoric mood, decreased concentration and agitation.       Objective:   Physical Exam  BP 126/85  Pulse 112  Temp(Src) 97.8 F (36.6 C) (Oral)  Wt 151 lb (68.493 kg)  General Appearance:    Alert, cooperative, no distress, appears stated age  Head:    Normocephalic, without obvious  abnormality, atraumatic  Eyes:    PERRL, conjunctiva/corneas clear, EOM's intact, fundi    benign, both eyes       Ears:    Normal TM's and external ear canals, both ears  Nose:   Nares normal, septum midline, mucosa normal, no drainage   or sinus tenderness  Throat:   Lips, mucosa, and tongue normal; poor dentition  Neck:   Supple, symmetrical, trachea midline, no adenopathy;       thyroid:  No enlargement/tenderness/nodules; no carotid   bruit or JVD     Lungs:     Clear to auscultation bilaterally, respirations unlabored     Heart:    Regular rate and rhythm, S1 and S2 normal, no murmur, rub   or gallop  Abdomen:     Soft, non-tender, bowel sounds active all four quadrants,    no masses, no organomegaly  Genitalia:    Uncircumcised penis, with foreskin retracted there is evidence of small erythamatous papules on rim of head of penis, no open lesions c/w genital herpes     Extremities:   Extremities normal, atraumatic, no cyanosis or edema  Pulses:   2+ and symmetric all extremities  Skin:   Skin color, texture, turgor normal, no rashes or lesions  Lymph nodes:   Cervical, supraclavicular, and axillary nodes normal      Labs: crAg negative Protein on UA     Assessment & Plan:  1) HIV = will ensure that pharmacy delivers next doses of medication. Will keep on atripla. Will check cd4 count and viral load since patient has been on therapy for 1 month. 2) pcp proph = continue with bactrim DS daily 3) OI proph = continue with azithro Qwk 4) thrush = resolved. 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

## 2011-12-03 NOTE — Telephone Encounter (Signed)
ADAP approved, sent rxes for [facility].

## 2011-12-04 LAB — T-HELPER CELL (CD4) - (RCID CLINIC ONLY): CD4 % Helper T Cell: 5 % — ABNORMAL LOW (ref 33–55)

## 2011-12-05 ENCOUNTER — Inpatient Hospital Stay: Payer: Self-pay | Admitting: Internal Medicine

## 2011-12-08 LAB — HIV-1 RNA QUANT-NO REFLEX-BLD
HIV 1 RNA Quant: 803 copies/mL — ABNORMAL HIGH (ref <20)
HIV-1 RNA Quant, Log: 2.9 {Log} — ABNORMAL HIGH (ref <1.30)

## 2011-12-09 LAB — AFB CULTURE WITH SMEAR (NOT AT ARMC): Acid Fast Smear: NONE SEEN

## 2011-12-09 LAB — AFB CULTURE, BLOOD

## 2012-01-05 ENCOUNTER — Other Ambulatory Visit: Payer: Self-pay | Admitting: *Deleted

## 2012-01-05 NOTE — Telephone Encounter (Signed)
Pharmacist shared that pt is confused about all the rxes he is to be taking.  RN and pharmacist reviewed that medications.  The pharmacist will call the pt to discuss/review the medications today.

## 2012-02-05 ENCOUNTER — Ambulatory Visit (INDEPENDENT_AMBULATORY_CARE_PROVIDER_SITE_OTHER): Payer: Medicaid Other | Admitting: Internal Medicine

## 2012-02-05 ENCOUNTER — Ambulatory Visit: Payer: Self-pay

## 2012-02-05 ENCOUNTER — Encounter: Payer: Self-pay | Admitting: Internal Medicine

## 2012-02-05 VITALS — BP 137/87 | HR 83 | Temp 97.4°F | Wt 165.0 lb

## 2012-02-05 DIAGNOSIS — B20 Human immunodeficiency virus [HIV] disease: Secondary | ICD-10-CM

## 2012-02-05 DIAGNOSIS — Z21 Asymptomatic human immunodeficiency virus [HIV] infection status: Secondary | ICD-10-CM

## 2012-02-05 DIAGNOSIS — R21 Rash and other nonspecific skin eruption: Secondary | ICD-10-CM

## 2012-02-05 MED ORDER — AQUAPHOR EX OINT: TOPICAL_OINTMENT | CUTANEOUS | Status: AC | PRN

## 2012-02-05 MED ORDER — AQUAPHOR EX OINT: TOPICAL_OINTMENT | CUTANEOUS | Status: DC | PRN

## 2012-02-05 MED ORDER — DIPHENHYDRAMINE HCL 25 MG PO CAPS: 25.0000 mg | ORAL_CAPSULE | ORAL | Status: DC | PRN

## 2012-02-06 LAB — CBC WITH DIFFERENTIAL/PLATELET
Basophils Relative: 1 % (ref 0–1)
HCT: 42.7 % (ref 39.0–52.0)
Hemoglobin: 15.3 g/dL (ref 13.0–17.0)
Lymphs Abs: 2.1 10*3/uL (ref 0.7–4.0)
MCH: 32.8 pg (ref 26.0–34.0)
MCHC: 35.8 g/dL (ref 30.0–36.0)
Monocytes Absolute: 0.6 10*3/uL (ref 0.1–1.0)
Monocytes Relative: 14 % — ABNORMAL HIGH (ref 3–12)
Neutro Abs: 1.2 10*3/uL — ABNORMAL LOW (ref 1.7–7.7)
Neutrophils Relative %: 28 % — ABNORMAL LOW (ref 43–77)
RBC: 4.66 MIL/uL (ref 4.22–5.81)

## 2012-02-06 LAB — T-HELPER CELL (CD4) - (RCID CLINIC ONLY): CD4 T Cell Abs: 120 uL — ABNORMAL LOW (ref 400–2700)

## 2012-02-06 LAB — COMPREHENSIVE METABOLIC PANEL
Albumin: 4 g/dL (ref 3.5–5.2)
Alkaline Phosphatase: 112 U/L (ref 39–117)
BUN: 13 mg/dL (ref 6–23)
CO2: 25 mEq/L (ref 19–32)
Calcium: 9 mg/dL (ref 8.4–10.5)
Glucose, Bld: 87 mg/dL (ref 70–99)
Potassium: 4.3 mEq/L (ref 3.5–5.3)
Sodium: 138 mEq/L (ref 135–145)
Total Protein: 8 g/dL (ref 6.0–8.3)

## 2012-02-09 LAB — HIV-1 RNA QUANT-NO REFLEX-BLD
HIV 1 RNA Quant: 27 copies/mL — ABNORMAL HIGH (ref <20)
HIV-1 RNA Quant, Log: 1.43 {Log} — ABNORMAL HIGH (ref <1.30)

## 2012-02-19 ENCOUNTER — Ambulatory Visit: Payer: Self-pay | Admitting: Internal Medicine

## 2012-02-24 ENCOUNTER — Ambulatory Visit (INDEPENDENT_AMBULATORY_CARE_PROVIDER_SITE_OTHER): Payer: Medicaid Other | Admitting: Internal Medicine

## 2012-02-24 ENCOUNTER — Encounter: Payer: Self-pay | Admitting: Internal Medicine

## 2012-02-24 VITALS — BP 114/75 | HR 84 | Temp 97.8°F | Wt 159.0 lb

## 2012-02-24 DIAGNOSIS — B20 Human immunodeficiency virus [HIV] disease: Secondary | ICD-10-CM

## 2012-02-24 DIAGNOSIS — Z21 Asymptomatic human immunodeficiency virus [HIV] infection status: Secondary | ICD-10-CM

## 2012-02-24 NOTE — Progress Notes (Signed)
HIV FOLLOW UP VISIT  RFV = routine follow up Subjective:    Patient ID: Gerald Cox, male    DOB: [birth year], 41 y.o.   MRN: [id number]  HPI santos is 40yo Male , HIV, Cd 4 count of 120 &  VL of 27. On atripla. He reports 100% adherence. He is doing well on his therapy and has not missed a dose and still is taking his oi proph as directed. 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 He will try to wear a mask more often. He denies fever/chills/nightsweats/diarrhea/n/v/constipation. He is tolerating the vivid dreams. Occasionally dreams of his wife in [place].  Prior to Admission medications   Medication Sig Start Date End Date Taking? Authorizing Provider  diphenhydrAMINE (BENADRYL) 25 mg capsule Take 1 capsule (25 mg total) by mouth every 4 (four) hours as needed for itching. 02/05/12 02/04/13 Yes Judyann Munson, MD  efavirenz-emtrictabine-tenofovir (ATRIPLA) 600-200-300 MG per tablet Take 1 tablet by mouth at bedtime. 12/03/11  Yes Judyann Munson, MD  mineral oil-hydrophilic petrolatum (AQUAPHOR) ointment Apply topically as needed for dry skin. 02/05/12 02/04/13 Yes Judyann Munson, MD  sulfamethoxazole-trimethoprim (BACTRIM DS) 800-160 MG per tablet Take 1 tablet by mouth daily. 12/03/11  Yes Judyann Munson, MD  azithromycin (ZITHROMAX) 600 MG tablet Take 2 tablets (1,200 mg total) by mouth every 7 (seven) days. 03/18/12   Judyann Munson, MD  valACYclovir (VALTREX) 1000 MG tablet Take 1 tablet (1,000 mg total) by mouth daily. 04/01/12 04/01/13  Judyann Munson, MD   Active Ambulatory Problems    Diagnosis Date Noted  . HIV (human immunodeficiency virus infection) 10/27/2011  . Hyponatremia 10/27/2011  . 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 Diarrhea 11/07/2011  . Thrush 11/17/2011   Resolved Ambulatory Problems    Diagnosis Date Noted  . Sepsis associated hypotension 10/27/2011  . Acute respiratory failure  10/27/2011  . Septic shock 10/27/2011  . ARDS (adult respiratory distress syndrome) 10/27/2011   Past Medical History  Diagnosis Date  . Typhoid fever   . Laceration of hand with tendon involvement 2002  . Angina   . Pneumonia    Review of Systems Review of Systems  Constitutional: Negative for fever, chills, diaphoresis, activity change, appetite change, fatigue and unexpected weight change.  HENT: Negative for congestion, sore throat, rhinorrhea, sneezing, trouble swallowing and sinus pressure.  Eyes: Negative for photophobia and visual disturbance.  Respiratory: Negative for cough, chest tightness, shortness of breath, wheezing and stridor.  Cardiovascular: Negative for chest pain, palpitations and leg swelling.  Gastrointestinal: Negative for nausea, vomiting, abdominal pain, diarrhea, constipation, blood in stool, abdominal distention and anal bleeding.  Genitourinary: Negative for dysuria, hematuria, flank pain and difficulty urinating.  Musculoskeletal: Negative for myalgias, back pain, joint swelling, arthralgias and gait problem.  Skin: Negative for color change, pallor, rash and wound.  Neurological: Negative for dizziness, tremors, weakness and light-headedness.  Hematological: Negative for adenopathy. Does not bruise/bleed easily.  Psychiatric/Behavioral: Negative for behavioral problems, confusion, sleep disturbance, dysphoric mood, decreased concentration and agitation.       Objective:   Physical Exam  Physical Exam  Constitutional: He is oriented to person, place, and time. He appears well-developed and well-nourished. No distress.  HENT:  Mouth/Throat: Oropharynx is clear and moist. No oropharyngeal exudate.  Cardiovascular: Normal rate, regular rhythm and normal heart sounds. Exam reveals no gallop and no friction rub.  No murmur heard.  Pulmonary/Chest: Effort normal and  breath sounds normal. No respiratory distress. He has no wheezes.  Abdominal: Soft. Bowel  sounds are normal. He exhibits no distension. There is no tenderness.  Lymphadenopathy:  He has no cervical adenopathy.  Neurological: He is alert and oriented to person, place, and time.  Skin: Small erythamatous papules to face and neck, non blanching non petechaie Psychiatric: He has a normal mood and affect. His behavior is normal.         Assessment & Plan:   hiv = continue with atripla qhs  oi proph = continue with bactrim ds daily for pcp proph; he will take azithromycin until we see him at next visit. By pill count he has 4 wks left.  Thrush= resolved, no longer needs to take fluconazole  Shingles= resolved, no longer needs to take valtrex  Rash= appears c/w contact dermatitis. Appears to be self-resolving. Will consider steroid cream if not improving  rtc in 7-8 wks

## 2012-03-18 ENCOUNTER — Other Ambulatory Visit: Payer: Self-pay | Admitting: Licensed Clinical Social Worker

## 2012-03-18 DIAGNOSIS — B20 Human immunodeficiency virus [HIV] disease: Secondary | ICD-10-CM

## 2012-03-18 MED ORDER — AZITHROMYCIN 600 MG PO TABS: 1200.0000 mg | ORAL_TABLET | ORAL | Status: DC

## 2012-04-01 ENCOUNTER — Other Ambulatory Visit: Payer: Self-pay | Admitting: *Deleted

## 2012-04-01 DIAGNOSIS — B370 Candidal stomatitis: Secondary | ICD-10-CM

## 2012-04-01 DIAGNOSIS — B20 Human immunodeficiency virus [HIV] disease: Secondary | ICD-10-CM

## 2012-04-01 MED ORDER — FLUCONAZOLE 100 MG PO TABS: 100.0000 mg | ORAL_TABLET | Freq: Every day | ORAL | Status: AC

## 2012-04-01 MED ORDER — VALACYCLOVIR HCL 1 G PO TABS: 1000.0000 mg | ORAL_TABLET | Freq: Every day | ORAL | Status: DC

## 2012-04-15 ENCOUNTER — Encounter: Payer: Self-pay | Admitting: Internal Medicine

## 2012-04-15 ENCOUNTER — Ambulatory Visit (INDEPENDENT_AMBULATORY_CARE_PROVIDER_SITE_OTHER): Payer: Self-pay | Admitting: Internal Medicine

## 2012-04-15 VITALS — BP 135/84 | HR 84 | Temp 97.6°F | Wt 160.0 lb

## 2012-04-15 DIAGNOSIS — Z21 Asymptomatic human immunodeficiency virus [HIV] infection status: Secondary | ICD-10-CM

## 2012-04-15 DIAGNOSIS — B20 Human immunodeficiency virus [HIV] disease: Secondary | ICD-10-CM

## 2012-04-15 LAB — CBC WITH DIFFERENTIAL/PLATELET
Basophils Absolute: 0 10*3/uL (ref 0.0–0.1)
Basophils Relative: 0 % (ref 0–1)
Eosinophils Absolute: 0.3 10*3/uL (ref 0.0–0.7)
Eosinophils Relative: 6 % — ABNORMAL HIGH (ref 0–5)
HCT: 45 % (ref 39.0–52.0)
Hemoglobin: 15.9 g/dL (ref 13.0–17.0)
Lymphocytes Relative: 50 % — ABNORMAL HIGH (ref 12–46)
Lymphs Abs: 2.5 10*3/uL (ref 0.7–4.0)
MCH: 33.1 pg (ref 26.0–34.0)
MCHC: 35.3 g/dL (ref 30.0–36.0)
MCV: 93.6 fL (ref 78.0–100.0)
Monocytes Absolute: 0.5 10*3/uL (ref 0.1–1.0)
Monocytes Relative: 9 % (ref 3–12)
Neutro Abs: 1.7 10*3/uL (ref 1.7–7.7)
Neutrophils Relative %: 35 % — ABNORMAL LOW (ref 43–77)
Platelets: 277 10*3/uL (ref 150–400)
RBC: 4.81 MIL/uL (ref 4.22–5.81)
RDW: 13.1 % (ref 11.5–15.5)
WBC: 5 10*3/uL (ref 4.0–10.5)

## 2012-04-15 LAB — COMPREHENSIVE METABOLIC PANEL
ALT: 62 U/L — ABNORMAL HIGH (ref 0–53)
CO2: 25 mEq/L (ref 19–32)
Calcium: 9.5 mg/dL (ref 8.4–10.5)
Chloride: 101 mEq/L (ref 96–112)
Sodium: 136 mEq/L (ref 135–145)
Total Bilirubin: 0.3 mg/dL (ref 0.3–1.2)
Total Protein: 8.2 g/dL (ref 6.0–8.3)

## 2012-04-15 NOTE — Progress Notes (Signed)
HIV CLINIC  RFV: routine follow up Subjective:    Patient ID: Gerald Cox, male    DOB: 31-Jul-1971, 41 y.o.   MRN: [id number]  HPI 41yo Male with HIV, CD 4 count of 120(6%)/VL 27 , atripla. As well as bactrim, azithro, valtrex, and fluconazole. Doing well, not missing any doses. No fever/chills/nightsweats/cough/diarrhea. He has occassional vivid dreams of his wife. He is doing well, working 10hrs per day as Education administrator. He states he has rash to face and neck. Denies any new detergent, no paints that get onto his skin. Somewhat itchy.  Prior to Admission medications   Medication Sig Start Date End Date Taking? Authorizing Provider  azithromycin (ZITHROMAX) 600 MG tablet Take 2 tablets (1,200 mg total) by mouth every 7 (seven) days. 03/18/12   Judyann Munson, MD  diphenhydrAMINE (BENADRYL) 25 mg capsule Take 1 capsule (25 mg total) by mouth every 4 (four) hours as needed for itching. 02/05/12 02/04/13  Judyann Munson, MD  efavirenz-emtrictabine-tenofovir (ATRIPLA) 600-200-300 MG per tablet Take 1 tablet by mouth at bedtime. 12/03/11   Judyann Munson, MD  fluconazole (DIFLUCAN) 100 MG tablet Take 1 tablet (100 mg total) by mouth daily. 04/01/12 05/01/12  Judyann Munson, MD  mineral oil-hydrophilic petrolatum (AQUAPHOR) ointment Apply topically as needed for dry skin. 02/05/12 02/04/13  Judyann Munson, MD  sulfamethoxazole-trimethoprim (BACTRIM DS) 800-160 MG per tablet Take 1 tablet by mouth daily. 12/03/11   Judyann Munson, MD  valACYclovir (VALTREX) 1000 MG tablet Take 1 tablet (1,000 mg total) by mouth daily. 04/01/12 04/01/13  Judyann Munson, MD   Active Ambulatory Problems    Diagnosis Date Noted  . HIV (human immunodeficiency virus infection) 10/27/2011  . Hyponatremia 10/27/2011  . 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 Diarrhea 11/07/2011  . Thrush 11/17/2011   Resolved Ambulatory Problems    Diagnosis Date Noted  . Sepsis associated hypotension 10/27/2011  . Acute respiratory  failure 10/27/2011  . Septic shock 10/27/2011  . ARDS (adult respiratory distress syndrome) 10/27/2011   Past Medical History  Diagnosis Date  . Typhoid fever   . Laceration of hand with tendon involvement 2002  . Angina   . Pneumonia    Sh = drinks 1-2 beers on weekends. No smoking. No illicit drugs.  Review of Systems  Constitutional: Negative for fever, chills, diaphoresis, activity change, appetite change, fatigue and unexpected weight change.  HENT: Negative for congestion, sore throat, rhinorrhea, sneezing, trouble swallowing and sinus pressure.  Eyes: Negative for photophobia and visual disturbance.  Respiratory: Negative for cough, chest tightness, shortness of breath, wheezing and stridor.  Cardiovascular: Negative for chest pain, palpitations and leg swelling.  Gastrointestinal: Negative for nausea, vomiting, abdominal pain, diarrhea, constipation, blood in stool, abdominal distention and anal bleeding.  Genitourinary: Negative for dysuria, hematuria, flank pain and difficulty urinating.  Musculoskeletal: Negative for myalgias, back pain, joint swelling, arthralgias and gait problem.  Skin: positive itchy rash to face and neck. Negative for color change, pallor,  and wound.  Neurological: Negative for dizziness, tremors, weakness and light-headedness.  Hematological: Negative for adenopathy. Does not bruise/bleed easily.  Psychiatric/Behavioral: Negative for behavioral problems, confusion, sleep disturbance, dysphoric mood, decreased concentration and agitation.       Objective:   Physical Exam Physical Exam  Constitutional: He is oriented to person, place, and time. He appears well-developed and well-nourished. No distress.  HENT:  Mouth/Throat: Oropharynx is clear and moist. No oropharyngeal exudate.  Cardiovascular: Normal rate, regular rhythm and normal heart sounds. Exam reveals no gallop and no friction  rub.  No murmur heard.  Pulmonary/Chest: Effort normal and  breath sounds normal. No respiratory distress. He has no wheezes.  Abdominal: Soft. Bowel sounds are normal. He exhibits no distension. There is no tenderness.  Lymphadenopathy:  He has no cervical adenopathy.  Neurological: He is alert and oriented to person, place, and time.  Skin: Skin is warm and dry. Mild small erythamatous papules to neck, scattered and a few on cheeks Psychiatric: He has a normal mood and affect. His behavior is normal.      Assessment & Plan:  hiv = continue with atripla; we will check cbc, cmp, cd 4 count and viral load today  oi prophylaxis= will only need to continue on bactrim and azithromycin. Likely to stop azithromycin at next visit if his CD 4 count > 100. Can d/c fluconazole  hsv proph= continue with valtrex 1gm daily  Allergic dermatitis of unknown etiology = will recommend to try hydrocortisone cream 1% BID to affected area x 2 wks.  rtc in 3 months

## 2012-04-19 LAB — HIV-1 RNA QUANT-NO REFLEX-BLD: HIV 1 RNA Quant: 27 copies/mL — ABNORMAL HIGH (ref <20)

## 2012-05-04 ENCOUNTER — Encounter: Payer: Self-pay | Admitting: Internal Medicine

## 2012-05-05 NOTE — Progress Notes (Signed)
HIV CLINIC NOTE  RFV= follow up on initiation of ART Subjective:    Patient ID: Gerald Cox, male    DOB: 15-Aug-1971, 41 y.o.   MRN: [id number]  HPI [name] is a spanish speaking male, 40yo, recently diagnosed with HIV, CD 4 count of 110(5%)/ VL 803. Reports having 100% adherence to Atripla, as well as OI proph of bactrim and azithro. He has had a recent 2 wk hx of rash to face and neck, only exposed area. 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 Otherwise doing well. He is happy that he is gaining weight. Denies having vivid dreams at this visit.  Prior to Admission medications   Medication Sig Start Date End Date Taking? Authorizing Provider  azithromycin (ZITHROMAX) 600 MG tablet Take 2 tablets (1,200 mg total) by mouth every 7 (seven) days. 03/18/12   Judyann Munson, MD  diphenhydrAMINE (BENADRYL) 25 mg capsule Take 1 capsule (25 mg total) by mouth every 4 (four) hours as needed for itching. 02/05/12 02/04/13  Judyann Munson, MD  efavirenz-emtrictabine-tenofovir (ATRIPLA) 600-200-300 MG per tablet Take 1 tablet by mouth at bedtime. 12/03/11   Judyann Munson, MD  mineral oil-hydrophilic petrolatum (AQUAPHOR) ointment Apply topically as needed for dry skin. 02/05/12 02/04/13  Judyann Munson, MD  sulfamethoxazole-trimethoprim (BACTRIM DS) 800-160 MG per tablet Take 1 tablet by mouth daily. 12/03/11   Judyann Munson, MD  valACYclovir (VALTREX) 1000 MG tablet Take 1 tablet (1,000 mg total) by mouth daily. 04/01/12 04/01/13  Judyann Munson, MD   Active Ambulatory Problems    Diagnosis Date Noted  . HIV (human immunodeficiency virus infection) 10/27/2011  . Hyponatremia 10/27/2011  . 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 Diarrhea 11/07/2011  . Thrush 11/17/2011   Resolved Ambulatory Problems    Diagnosis Date Noted  . Sepsis associated hypotension 10/27/2011  . Acute respiratory failure 10/27/2011  . Septic  shock 10/27/2011  . ARDS (adult respiratory distress syndrome) 10/27/2011   Past Medical History  Diagnosis Date  . Typhoid fever   . Laceration of hand with tendon involvement 2002  . Angina   . Pneumonia     Review of Systems Review of Systems  Constitutional: Negative for fever, chills, diaphoresis, activity change, appetite change, fatigue and unexpected weight change.  HENT: Negative for congestion, sore throat, rhinorrhea, sneezing, trouble swallowing and sinus pressure.  Eyes: Negative for photophobia and visual disturbance.  Respiratory: Negative for cough, chest tightness, shortness of breath, wheezing and stridor.  Cardiovascular: Negative for chest pain, palpitations and leg swelling.  Gastrointestinal: Negative for nausea, vomiting, abdominal pain, diarrhea, constipation, blood in stool, abdominal distention and anal bleeding.  Genitourinary: Negative for dysuria, hematuria, flank pain and difficulty urinating.  Musculoskeletal: Negative for myalgias, back pain, joint swelling, arthralgias and gait problem.  Skin: positive for rash to face Neurological: Negative for dizziness, tremors, weakness and light-headedness.  Hematological: Negative for adenopathy. Does not bruise/bleed easily.  Psychiatric/Behavioral: Negative for behavioral problems, confusion, sleep disturbance, dysphoric mood, decreased concentration and agitation.     Objective:   Physical Exam BP 137/87  Pulse 83  Temp(Src) 97.4 F (36.3 C) (Oral)  Wt 165 lb (74.844 kg)  Constitutional: He is oriented to person, place, and time. He appears well-developed and well-nourished. No distress.  HENT:  Mouth/Throat: Oropharynx is clear and moist. No oropharyngeal exudate.  Cardiovascular: Normal rate, regular rhythm and normal heart sounds. Exam reveals no gallop and no friction rub.  No murmur heard.  Pulmonary/Chest: Effort normal and breath sounds normal. No respiratory distress. He has no wheezes.    Abdominal: Soft. Bowel sounds are normal. He exhibits no distension. There is no tenderness.  Lymphadenopathy:  He has no cervical adenopathy.  Neurological: He is alert and oriented to person, place, and time.  Skin: erythamatous papules to face Psychiatric: He has a normal mood and affect. His behavior is normal.       Assessment & Plan:  Hiv= continue on atripla. 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 Will give aquaphor lotion and benadryl tabs to help with symptom relief  rtc in 2 months or sooner if problem arises

## 2012-05-31 ENCOUNTER — Other Ambulatory Visit: Payer: Self-pay | Admitting: *Deleted

## 2012-05-31 DIAGNOSIS — B20 Human immunodeficiency virus [HIV] disease: Secondary | ICD-10-CM

## 2012-05-31 MED ORDER — SULFAMETHOXAZOLE-TMP DS 800-160 MG PO TABS: 1.0000 | ORAL_TABLET | Freq: Every day | ORAL | Status: DC

## 2012-05-31 MED ORDER — EFAVIRENZ-EMTRICITAB-TENOFOVIR 600-200-300 MG PO TABS: 1.0000 | ORAL_TABLET | Freq: Every day | ORAL | Status: DC

## 2012-06-03 ENCOUNTER — Ambulatory Visit: Payer: Medicaid Other

## 2012-06-07 ENCOUNTER — Ambulatory Visit: Payer: Medicaid Other

## 2012-07-01 ENCOUNTER — Encounter: Payer: Self-pay | Admitting: Internal Medicine

## 2012-07-01 ENCOUNTER — Ambulatory Visit (INDEPENDENT_AMBULATORY_CARE_PROVIDER_SITE_OTHER): Payer: Medicaid Other | Admitting: Internal Medicine

## 2012-07-01 ENCOUNTER — Encounter: Payer: Self-pay | Admitting: *Deleted

## 2012-07-01 ENCOUNTER — Other Ambulatory Visit: Payer: Medicaid Other

## 2012-07-01 ENCOUNTER — Other Ambulatory Visit: Payer: Self-pay | Admitting: Internal Medicine

## 2012-07-01 VITALS — BP 113/76 | HR 94 | Temp 98.3°F | Wt 153.0 lb

## 2012-07-01 DIAGNOSIS — Z21 Asymptomatic human immunodeficiency virus [HIV] infection status: Secondary | ICD-10-CM

## 2012-07-01 DIAGNOSIS — R05 Cough: Secondary | ICD-10-CM

## 2012-07-01 DIAGNOSIS — J189 Pneumonia, unspecified organism: Secondary | ICD-10-CM

## 2012-07-01 DIAGNOSIS — B20 Human immunodeficiency virus [HIV] disease: Secondary | ICD-10-CM

## 2012-07-01 LAB — COMPREHENSIVE METABOLIC PANEL
ALT: 46 U/L (ref 0–53)
AST: 44 U/L — ABNORMAL HIGH (ref 0–37)
Albumin: 4 g/dL (ref 3.5–5.2)
Alkaline Phosphatase: 117 U/L (ref 39–117)
Calcium: 8.7 mg/dL (ref 8.4–10.5)
Chloride: 104 mEq/L (ref 96–112)
Creat: 0.84 mg/dL (ref 0.50–1.35)
Potassium: 3.5 mEq/L (ref 3.5–5.3)

## 2012-07-01 LAB — CBC WITH DIFFERENTIAL/PLATELET
Basophils Absolute: 0 10*3/uL (ref 0.0–0.1)
Basophils Relative: 0 % (ref 0–1)
Lymphocytes Relative: 50 % — ABNORMAL HIGH (ref 12–46)
MCHC: 35.7 g/dL (ref 30.0–36.0)
Neutro Abs: 1.5 10*3/uL — ABNORMAL LOW (ref 1.7–7.7)
Platelets: 224 10*3/uL (ref 150–400)
RDW: 12.9 % (ref 11.5–15.5)
WBC: 4.6 10*3/uL (ref 4.0–10.5)

## 2012-07-01 MED ORDER — MOXIFLOXACIN HCL 400 MG PO TABS: 400.0000 mg | ORAL_TABLET | Freq: Every day | ORAL | Status: AC

## 2012-07-01 MED ORDER — GUAIFENESIN-DM 100-10 MG/5ML PO SYRP: 5.0000 mL | ORAL_SOLUTION | Freq: Three times a day (TID) | ORAL | Status: AC | PRN

## 2012-07-01 NOTE — Progress Notes (Signed)
HIV CLINIC  RFV: sick visit, productive cough and nasal congestion for a week Subjective:    Patient ID: Gerald Cox, male    DOB: May 08, 1971, 41 y.o.   MRN: [id number]  HPI Gerald Cox is a 41yo Male with HIV, CD 4 count of 140(6%)/VL 27 as of April 2013, currently on atripla, and bactrim for OI prophylaxis. He reports having nasal congestion and productive cough for the past week. He also noted to have low grade fevers of 100.6. He states that he has a productive cough with whitish-yellow sputum. He hasn't tried any OTC medications  Current Outpatient Prescriptions on File Prior to Visit  Medication Sig Dispense Refill  . azithromycin (ZITHROMAX) 600 MG tablet Take 2 tablets (1,200 mg total) by mouth every 7 (seven) days.  10 tablet  5  . diphenhydrAMINE (BENADRYL) 25 mg capsule Take 1 capsule (25 mg total) by mouth every 4 (four) hours as needed for itching.  24 capsule  2  . efavirenz-emtrictabine-tenofovir (ATRIPLA) 600-200-300 MG per tablet Take 1 tablet by mouth at bedtime.  30 tablet  5  . mineral oil-hydrophilic petrolatum (AQUAPHOR) ointment Apply topically as needed for dry skin.  50 g  0  . sulfamethoxazole-trimethoprim (BACTRIM DS) 800-160 MG per tablet Take 1 tablet by mouth daily.  30 tablet  5  . valACYclovir (VALTREX) 1000 MG tablet Take 1 tablet (1,000 mg total) by mouth daily.  30 tablet  5   Active Ambulatory Problems    Diagnosis Date Noted  . HIV (human immunodeficiency virus infection) 10/27/2011  . Hyponatremia 10/27/2011  . 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 Diarrhea 11/07/2011  . Thrush 11/17/2011   Resolved Ambulatory Problems    Diagnosis Date Noted  . Sepsis associated hypotension 10/27/2011  . Acute respiratory failure 10/27/2011  . Septic shock 10/27/2011  . ARDS (adult respiratory distress syndrome) 10/27/2011   Past Medical History  Diagnosis Date  . Typhoid fever   . Laceration of hand with tendon involvement 2002  . Angina   .  Pneumonia     Review of Systems  Constitutional: Negative for fever, chills, diaphoresis, activity change, appetite change, fatigue and unexpected weight change.  HENT: positive for congestion, sore throat, rhinorrhea, sneezing, trouble swallowing and no sinus pressure.  Eyes: Negative for photophobia and visual disturbance.  Respiratory: positive for cough,  shortness of breath, and no wheezing and stridor.  Cardiovascular: Negative for chest pain, palpitations and leg swelling.  Gastrointestinal: Negative for nausea, vomiting, abdominal pain, diarrhea, constipation, blood in stool, abdominal distention and anal bleeding.  Genitourinary: Negative for dysuria, hematuria, flank pain and difficulty urinating.  Musculoskeletal: Negative for myalgias, back pain, joint swelling, arthralgias and gait problem.  Skin: Negative for color change, pallor, rash and wound.  Neurological: Negative for dizziness, tremors, weakness and light-headedness.  Hematological: Negative for adenopathy. Does not bruise/bleed easily.  Psychiatric/Behavioral: Negative for behavioral problems, confusion, sleep disturbance, dysphoric mood, decreased concentration and agitation.       Objective:   Physical Exam BP 113/76  Pulse 94  Temp 98.3 F (36.8 C) (Oral)  Wt 153 lb (69.4 kg) Physical Exam  Constitutional: He is oriented to person, place, and time. He appears well-developed and well-nourished. No distress.  HENT: Rains/AT, PERRLA, EOMI, some nasal congestion, TM clear Mouth/Throat: Oropharynx is clear and moist. Mild OP exudate.  Cardiovascular: Normal rate, regular rhythm and normal heart sounds. Exam reveals no gallop and no friction rub.  No murmur heard.  Pulmonary/Chest: Effort normal and breath sounds  normal. No respiratory distress. He has no wheezes. ? Mild rhonchi at right base Abdominal: Soft. Bowel sounds are normal. He exhibits no distension. There is no tenderness.  Lymphadenopathy:  He has no  cervical adenopathy.  Neurological: He is alert and oriented to person, place, and time.  Skin: Skin is warm and dry. No rash noted. No erythema.  Psychiatric: He has a normal mood and affect. His behavior is normal.      Assessment & Plan:   bronchitis = concern for bacterial infection, since the patient has been having fevers and symptoms not improving after 7 days. Will empirically treat for bronchitis/pneumonia with a 5 day course of moxifloxacin. Also treat his symptoms with robitussin DM  HIV = continue with atripla  OI proph = continue with batrim  rtc in 3 months

## 2012-07-03 LAB — HIV-1 RNA QUANT-NO REFLEX-BLD
HIV 1 RNA Quant: <20 copies/mL (ref <20)
HIV-1 RNA Quant, Log: <1.3 {Log} (ref <1.30)

## 2012-07-15 ENCOUNTER — Encounter: Payer: Self-pay | Admitting: Internal Medicine

## 2012-07-15 ENCOUNTER — Ambulatory Visit (INDEPENDENT_AMBULATORY_CARE_PROVIDER_SITE_OTHER): Payer: Self-pay | Admitting: Internal Medicine

## 2012-07-15 ENCOUNTER — Ambulatory Visit: Payer: Self-pay | Admitting: Internal Medicine

## 2012-07-15 VITALS — BP 129/78 | HR 82 | Temp 98.2°F | Wt 156.0 lb

## 2012-07-15 DIAGNOSIS — Z21 Asymptomatic human immunodeficiency virus [HIV] infection status: Secondary | ICD-10-CM

## 2012-07-15 DIAGNOSIS — B20 Human immunodeficiency virus [HIV] disease: Secondary | ICD-10-CM

## 2012-07-15 NOTE — Progress Notes (Signed)
HIV CLINIC VISIT  RFV: 3 month check up Subjective:    Patient ID: Gerald Cox, male    DOB: 1971/10/21, 41 y.o.   MRN: [id number]  HPI Gerald Cox is a 40yo HM, spanish speaking with interpreter, HIV, CD 4 count of 120(5%)/VL <20. Has been on therapy for 9 months on atripla with good adherence. No missing doses. Continues to be on bactrim OI proph and vatlrex for HSV suppression. Last saw him last month, when he had a cold concerning for bronchitis, he was treated with OTC. Doing well now. No complaints. Working doing [employer] scheme manager. He is eating well. Some weight loss since restarting work, but he attributes it to more activity. No loss of appetite.  Current Outpatient Prescriptions on File Prior to Visit  Medication Sig Dispense Refill  . azithromycin (ZITHROMAX) 600 MG tablet Take 2 tablets (1,200 mg total) by mouth every 7 (seven) days.  10 tablet  5  . diphenhydrAMINE (BENADRYL) 25 mg capsule Take 1 capsule (25 mg total) by mouth every 4 (four) hours as needed for itching.  24 capsule  2  . efavirenz-emtrictabine-tenofovir (ATRIPLA) 600-200-300 MG per tablet Take 1 tablet by mouth at bedtime.  30 tablet  5  . mineral oil-hydrophilic petrolatum (AQUAPHOR) ointment Apply topically as needed for dry skin.  50 g  0  . sulfamethoxazole-trimethoprim (BACTRIM DS) 800-160 MG per tablet Take 1 tablet by mouth daily.  30 tablet  5  . valACYclovir (VALTREX) 1000 MG tablet Take 1 tablet (1,000 mg total) by mouth daily.  30 tablet  5   Active Ambulatory Problems    Diagnosis Date Noted  . HIV (human immunodeficiency virus infection) 10/27/2011  . Hyponatremia 10/27/2011  . 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 Diarrhea 11/07/2011  . Thrush 11/17/2011   Resolved Ambulatory Problems    Diagnosis Date Noted  . Sepsis associated hypotension 10/27/2011  . Acute respiratory failure 10/27/2011  . Septic shock 10/27/2011  . ARDS (adult respiratory distress syndrome) 10/27/2011   Past Medical  History  Diagnosis Date  . Typhoid fever   . Laceration of hand with tendon involvement 2002  . Angina   . Pneumonia       Review of Systems 10 point ROS reviewed, no positive pertinents    Objective:   Physical Exam  BP 129/78  Pulse 82  Temp 98.2 F (36.8 C) (Oral)  Wt 156 lb (70.761 kg) Physical Exam  Constitutional: He is oriented to person, place, and time. He appears well-developed and well-nourished. No distress.  HENT:  Mouth/Throat: Oropharynx is clear and moist. No oropharyngeal exudate.  Cardiovascular: Normal rate, regular rhythm and normal heart sounds. Exam reveals no gallop and no friction rub.  No murmur heard.  Pulmonary/Chest: Effort normal and breath sounds normal. No respiratory distress. He has no wheezes.  Abdominal: Soft. Bowel sounds are normal. He exhibits no distension. There is no tenderness.  Lymphadenopathy:  He has no cervical adenopathy.  Neurological: He is alert and oriented to person, place, and time.  Skin: Skin is warm and dry. No rash noted. No erythema.  Psychiatric: He has a normal mood and affect. His behavior is normal.      Assessment & Plan:  hiv = continue on atripla  oi proph = continue on bactrim DS daily; no longer needs azithromycin  HSV suppression = continue on valtrex 500mg  bid  rtc in 3 months; will do labs, and influenza vax

## 2012-09-07 ENCOUNTER — Other Ambulatory Visit: Payer: Self-pay | Admitting: *Deleted

## 2012-09-07 DIAGNOSIS — Z113 Encounter for screening for infections with a predominantly sexual mode of transmission: Secondary | ICD-10-CM

## 2012-09-28 ENCOUNTER — Other Ambulatory Visit: Payer: Self-pay | Admitting: *Deleted

## 2012-09-28 DIAGNOSIS — B20 Human immunodeficiency virus [HIV] disease: Secondary | ICD-10-CM

## 2012-09-28 MED ORDER — VALACYCLOVIR HCL 1 G PO TABS: 1000.0000 mg | ORAL_TABLET | Freq: Every day | ORAL | Status: DC

## 2012-09-29 ENCOUNTER — Other Ambulatory Visit: Payer: Self-pay | Admitting: *Deleted

## 2012-09-29 DIAGNOSIS — B20 Human immunodeficiency virus [HIV] disease: Secondary | ICD-10-CM

## 2012-09-29 MED ORDER — VALACYCLOVIR HCL 1 G PO TABS: 1000.0000 mg | ORAL_TABLET | Freq: Every day | ORAL | Status: DC

## 2012-10-05 ENCOUNTER — Other Ambulatory Visit: Payer: Self-pay

## 2012-10-07 ENCOUNTER — Other Ambulatory Visit: Payer: Self-pay | Admitting: *Deleted

## 2012-10-19 ENCOUNTER — Ambulatory Visit: Payer: Self-pay | Admitting: Internal Medicine

## 2012-11-03 ENCOUNTER — Encounter: Payer: Self-pay | Admitting: *Deleted

## 2012-11-03 ENCOUNTER — Other Ambulatory Visit: Payer: Self-pay | Admitting: *Deleted

## 2012-11-03 DIAGNOSIS — B370 Candidal stomatitis: Secondary | ICD-10-CM

## 2012-11-03 MED ORDER — FLUCONAZOLE 100 MG PO TABS: 100.0000 mg | ORAL_TABLET | Freq: Every day | ORAL | Status: DC

## 2012-11-03 NOTE — Telephone Encounter (Signed)
Pt needs return appts for labs and MD.  Will send the pt a letter translated into Spanish.

## 2012-11-22 ENCOUNTER — Telehealth: Payer: Self-pay | Admitting: *Deleted

## 2012-11-22 NOTE — Telephone Encounter (Signed)
Unable to contact pt by phone, mail box full.  Sent letter to address in record.  Letter returned.  Pt needs to make follow-up lab and MD appointments.

## 2012-11-23 NOTE — Telephone Encounter (Signed)
Pt referred to [facility] Counselor for follow-up.

## 2012-11-29 ENCOUNTER — Other Ambulatory Visit: Payer: Self-pay | Admitting: *Deleted

## 2012-11-29 DIAGNOSIS — B20 Human immunodeficiency virus [HIV] disease: Secondary | ICD-10-CM

## 2012-11-29 MED ORDER — EFAVIRENZ-EMTRICITAB-TENOFOVIR 600-200-300 MG PO TABS: 1.0000 | ORAL_TABLET | Freq: Every day | ORAL | Status: DC

## 2012-11-29 MED ORDER — SULFAMETHOXAZOLE-TMP DS 800-160 MG PO TABS: 1.0000 | ORAL_TABLET | Freq: Every day | ORAL | Status: DC

## 2012-12-18 ENCOUNTER — Emergency Department (HOSPITAL_COMMUNITY): Payer: Self-pay

## 2012-12-18 ENCOUNTER — Encounter (HOSPITAL_COMMUNITY): Payer: Self-pay | Admitting: *Deleted

## 2012-12-18 ENCOUNTER — Emergency Department (HOSPITAL_COMMUNITY)
Admission: EM | Admit: 2012-12-18 | Discharge: 2012-12-18 | Disposition: A | Discharge status: Home / Self Care | Payer: Self-pay | Attending: Emergency Medicine | Admitting: Emergency Medicine

## 2012-12-18 DIAGNOSIS — S82009A Unspecified fracture of unspecified patella, initial encounter for closed fracture: Secondary | ICD-10-CM

## 2012-12-18 DIAGNOSIS — Z9889 Other specified postprocedural states: Secondary | ICD-10-CM | POA: Insufficient documentation

## 2012-12-18 DIAGNOSIS — G8911 Acute pain due to trauma: Secondary | ICD-10-CM | POA: Insufficient documentation

## 2012-12-18 DIAGNOSIS — R059 Cough, unspecified: Secondary | ICD-10-CM | POA: Insufficient documentation

## 2012-12-18 DIAGNOSIS — Z8701 Personal history of pneumonia (recurrent): Secondary | ICD-10-CM | POA: Insufficient documentation

## 2012-12-18 DIAGNOSIS — Z79899 Other long term (current) drug therapy: Secondary | ICD-10-CM | POA: Insufficient documentation

## 2012-12-18 DIAGNOSIS — R209 Unspecified disturbances of skin sensation: Secondary | ICD-10-CM | POA: Insufficient documentation

## 2012-12-18 DIAGNOSIS — R05 Cough: Secondary | ICD-10-CM | POA: Insufficient documentation

## 2012-12-18 DIAGNOSIS — Z87891 Personal history of nicotine dependence: Secondary | ICD-10-CM | POA: Insufficient documentation

## 2012-12-18 DIAGNOSIS — B20 Human immunodeficiency virus [HIV] disease: Secondary | ICD-10-CM | POA: Insufficient documentation

## 2012-12-18 DIAGNOSIS — M79609 Pain in unspecified limb: Secondary | ICD-10-CM
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
## 2012-12-18 NOTE — Progress Notes (Signed)
Orthopedic Tech Progress Note Patient Details:  Gerald Cox 10/03/1971 [id number]  Ortho Devices Type of Ortho Device: Knee Immobilizer Ortho Device/Splint Location: LEFT KNEE IMMOBILIZER Ortho Device/Splint Interventions: Application   Shawnie Pons 12/18/2012, 5:14 PM

## 2012-12-18 NOTE — ED Notes (Signed)
Back from xray
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
## 2012-12-18 NOTE — ED Provider Notes (Signed)
Discharge instructions discussed with patient via interpreter service.  Patient likely will not be able to return to [place] for follow-up--on-call local orthopedist information provided.  Jimmye Norman, NP 12/18/12 [phone]
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
## 2012-12-18 NOTE — ED Provider Notes (Signed)
Medical screening examination/treatment/procedure(s) were performed by non-physician practitioner and as supervising physician I was immediately available for consultation/collaboration.   Hurman Horn, MD 12/18/12 2026

## 2012-12-18 NOTE — ED Notes (Signed)
Resting quietly in bed NAD noted at this time

## 2012-12-18 NOTE — ED Notes (Signed)
Patient transported to X-ray 

## 2012-12-18 NOTE — Progress Notes (Signed)
VASCULAR LAB PRELIMINARY  PRELIMINARY  PRELIMINARY  PRELIMINARY  Left lower extremity venous duplex completed.    Preliminary report:  **Left:  No evidence of DVT, superficial thrombosis, or Baker's cyst.  Isami Mehra, RVS 12/18/2012, 2:53 PM

## 2012-12-18 NOTE — ED Notes (Signed)
Patient discharged with instructions in spanish he advises that he understands.
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
## 2012-12-18 NOTE — ED Notes (Signed)
Tech in for doppler study

## 2012-12-19 NOTE — ED Provider Notes (Signed)
Medical screening examination/treatment/procedure(s) were conducted as a shared visit with non-physician practitioner(s) and myself.  I personally evaluated the patient during the encounter  Karra Pink R. Burt Piatek, MD 12/19/12 0848 

## 2012-12-27 ENCOUNTER — Encounter: Payer: Self-pay | Admitting: *Deleted

## 2012-12-29 ENCOUNTER — Other Ambulatory Visit: Payer: Self-pay | Admitting: *Deleted

## 2012-12-29 DIAGNOSIS — B20 Human immunodeficiency virus [HIV] disease: Secondary | ICD-10-CM

## 2012-12-30 ENCOUNTER — Other Ambulatory Visit (INDEPENDENT_AMBULATORY_CARE_PROVIDER_SITE_OTHER): Payer: Self-pay

## 2012-12-30 DIAGNOSIS — B20 Human immunodeficiency virus [HIV] disease: Secondary | ICD-10-CM

## 2012-12-30 DIAGNOSIS — Z113 Encounter for screening for infections with a predominantly sexual mode of transmission: Secondary | ICD-10-CM

## 2012-12-30 LAB — COMPLETE METABOLIC PANEL WITH GFR
ALT: 42 U/L (ref 0–53)
Alkaline Phosphatase: 116 U/L (ref 39–117)
CO2: 27 mEq/L (ref 19–32)
GFR, Est African American: >89 mL/min
Sodium: 137 mEq/L (ref 135–145)
Total Bilirubin: 0.3 mg/dL (ref 0.3–1.2)
Total Protein: 8.1 g/dL (ref 6.0–8.3)

## 2012-12-30 LAB — CBC WITH DIFFERENTIAL/PLATELET
Eosinophils Absolute: 0.3 10*3/uL (ref 0.0–0.7)
Lymphs Abs: 2.3 10*3/uL (ref 0.7–4.0)
MCH: 33.3 pg (ref 26.0–34.0)
Neutro Abs: 2.6 10*3/uL (ref 1.7–7.7)
Neutrophils Relative %: 45 % (ref 43–77)
Platelets: 328 10*3/uL (ref 150–400)
RBC: 4.81 MIL/uL (ref 4.22–5.81)
WBC: 5.7 10*3/uL (ref 4.0–10.5)

## 2012-12-31 LAB — HIV-1 RNA QUANT-NO REFLEX-BLD
HIV 1 RNA Quant: <20 copies/mL (ref <20)
HIV-1 RNA Quant, Log: <1.3 {Log} (ref <1.30)

## 2013-01-19 ENCOUNTER — Ambulatory Visit (INDEPENDENT_AMBULATORY_CARE_PROVIDER_SITE_OTHER): Payer: Self-pay | Admitting: Internal Medicine

## 2013-01-19 ENCOUNTER — Encounter: Payer: Self-pay | Admitting: Internal Medicine

## 2013-01-19 VITALS — BP 124/80 | HR 101 | Temp 97.5°F | Wt 161.0 lb

## 2013-01-19 DIAGNOSIS — Z23 Encounter for immunization: Secondary | ICD-10-CM
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
## 2013-03-01 ENCOUNTER — Other Ambulatory Visit: Payer: Self-pay | Admitting: Licensed Clinical Social Worker

## 2013-03-01 DIAGNOSIS — B20 Human immunodeficiency virus [HIV] disease: Secondary | ICD-10-CM

## 2013-03-01 MED ORDER — EFAVIRENZ-EMTRICITAB-TENOFOVIR 600-200-300 MG PO TABS: 1.0000 | ORAL_TABLET | Freq: Every day | ORAL | Status: DC

## 2013-03-24 ENCOUNTER — Encounter: Payer: Self-pay | Admitting: *Deleted

## 2013-03-29 ENCOUNTER — Other Ambulatory Visit: Payer: Self-pay | Admitting: Licensed Clinical Social Worker

## 2013-03-29 DIAGNOSIS — B20 Human immunodeficiency virus [HIV] disease: Secondary | ICD-10-CM

## 2013-03-29 MED ORDER — SULFAMETHOXAZOLE-TMP DS 800-160 MG PO TABS: 1.0000 | ORAL_TABLET | Freq: Every day | ORAL | Status: DC

## 2013-04-04 ENCOUNTER — Other Ambulatory Visit: Payer: Self-pay | Admitting: *Deleted

## 2013-04-04 DIAGNOSIS — B20 Human immunodeficiency virus [HIV] disease: Secondary | ICD-10-CM

## 2013-04-04 MED ORDER — SULFAMETHOXAZOLE-TMP DS 800-160 MG PO TABS: 1.0000 | ORAL_TABLET | Freq: Every day | ORAL | Status: DC

## 2013-04-18 ENCOUNTER — Other Ambulatory Visit (INDEPENDENT_AMBULATORY_CARE_PROVIDER_SITE_OTHER): Payer: Self-pay

## 2013-04-18 ENCOUNTER — Other Ambulatory Visit: Payer: Self-pay | Admitting: Infectious Diseases

## 2013-04-18 DIAGNOSIS — Z113 Encounter for screening for infections with a predominantly sexual mode of transmission: Secondary | ICD-10-CM

## 2013-04-18 DIAGNOSIS — Z79899 Other long term (current) drug therapy: Secondary | ICD-10-CM

## 2013-04-18 DIAGNOSIS — B20 Human immunodeficiency virus [HIV] disease: Secondary | ICD-10-CM

## 2013-04-18 LAB — COMPREHENSIVE METABOLIC PANEL
Albumin: 4.4 g/dL (ref 3.5–5.2)
Alkaline Phosphatase: 142 U/L — ABNORMAL HIGH (ref 39–117)
CO2: 23 mEq/L (ref 19–32)
Glucose, Bld: 90 mg/dL (ref 70–99)
Potassium: 4.3 mEq/L (ref 3.5–5.3)
Sodium: 137 mEq/L (ref 135–145)
Total Protein: 8.1 g/dL (ref 6.0–8.3)

## 2013-04-18 LAB — CBC WITH DIFFERENTIAL/PLATELET
Hemoglobin: 16 g/dL (ref 13.0–17.0)
Lymphocytes Relative: 48 % — ABNORMAL HIGH (ref 12–46)
Lymphs Abs: 2.6 10*3/uL (ref 0.7–4.0)
Monocytes Relative: 11 % (ref 3–12)
Neutro Abs: 1.9 10*3/uL (ref 1.7–7.7)
Neutrophils Relative %: 36 % — ABNORMAL LOW (ref 43–77)
RBC: 4.85 MIL/uL (ref 4.22–5.81)

## 2013-04-18 LAB — LIPID PANEL: Cholesterol: 229 mg/dL — ABNORMAL HIGH (ref 0–200)

## 2013-04-18 LAB — RPR

## 2013-04-19 LAB — HIV-1 RNA QUANT-NO REFLEX-BLD
HIV 1 RNA Quant: 21 copies/mL (ref <20)
HIV-1 RNA Quant, Log: 1.32 {Log} — ABNORMAL HIGH (ref <1.30)

## 2013-04-19 LAB — T-HELPER CELL (CD4) - (RCID CLINIC ONLY): CD4 % Helper T Cell: 10 % — ABNORMAL LOW (ref 33–55)

## 2013-05-03 ENCOUNTER — Encounter: Payer: Self-pay | Admitting: Internal Medicine

## 2013-05-03 ENCOUNTER — Ambulatory Visit (INDEPENDENT_AMBULATORY_CARE_PROVIDER_SITE_OTHER): Payer: Self-pay | Admitting: Internal Medicine

## 2013-05-03 VITALS — BP 124/82 | HR 94 | Temp 97.7°F | Wt 165.0 lb

## 2013-05-03 DIAGNOSIS — B20 Human immunodeficiency virus [HIV] disease: Secondary | ICD-10-CM

## 2013-05-03 DIAGNOSIS — E781 Pure hyperglyceridemia: Secondary | ICD-10-CM

## 2013-05-03 MED ORDER — ELVITEG-COBIC-EMTRICIT-TENOFDF 150-150-200-300 MG PO TABS: 1.0000 | ORAL_TABLET | Freq: Every day | ORAL | Status: DC

## 2013-05-03 MED ORDER — FENOFIBRATE 145 MG PO TABS: 145.0000 mg | ORAL_TABLET | Freq: Every day | ORAL | Status: DC
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
## 2013-05-10 ENCOUNTER — Ambulatory Visit (INDEPENDENT_AMBULATORY_CARE_PROVIDER_SITE_OTHER): Payer: Self-pay | Admitting: Internal Medicine

## 2013-05-10 DIAGNOSIS — B20 Human immunodeficiency virus [HIV] disease: Secondary | ICD-10-CM

## 2013-05-10 DIAGNOSIS — Z21 Asymptomatic human immunodeficiency virus [HIV] infection status: Secondary | ICD-10-CM

## 2013-05-10 NOTE — Progress Notes (Signed)
HPI: Gerald Cox is a 42 y.o. male who is here for medication consult.  Allergies: No Known Allergies  Vitals:    Past Medical History: Past Medical History  Diagnosis Date  . 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 Laceration of hand with tendon involvement 2002  . Angina   . Pneumonia     Social History: History   Social History  . Marital Status: Single    Spouse Name: N/A    Number of Children: N/A  . Years of Education: N/A   Social History Main Topics  . Smoking status: Former Smoker -- 1.00 packs/day    Types: Cigars    Quit date: 10/06/2011  . Smokeless tobacco: Never Used     Comment: stopped because he was so short of breath and coughing a lot  . Alcohol Use: 50.4 oz/week    84 Cans of beer per week  . Drug Use: Yes    Special: Cocaine, Marijuana  . Sexually Active: Yes -- Male partner(s)   Other Topics Concern  . Not on file   Social History Narrative   Pt is originally [place] - has been living in [place] for 7 years. He lives in [place] with his daughter-in-law.  Pt works as a Education administrator. He is married, but his wife still lives in [place].  He has not seen her in 5-6 years. He reports he has had 2-3 sexual encounters since arriving in the [place].      Previous Regimen: Atripla  Current Regimen: Stribild  Labs: HIV 1 RNA Quant (copies/mL)  Date Value  04/18/2013 21   12/30/2012 <20   07/01/2012 <20      CD4 T Cell Abs (cmm)  Date Value  04/18/2013 270*  12/30/2012 220*  07/01/2012 120*     Hep B S Ab (no units)  Date Value  10/28/2011 NEGATIVE      Hepatitis B Surface Ag (no units)  Date Value  10/28/2011 NEGATIVE      HCV Ab (no units)  Date Value  10/28/2011 NEGATIVE     CrCl: The CrCl is unknown because both a height and weight (above a minimum accepted value) are required for this calculation.  Lipids:    Component Value Date/Time   CHOL 229* 04/18/2013 1010   TRIG 514* 04/18/2013 1010   HDL 34* 04/18/2013 1010   CHOLHDL 6.7  04/18/2013 1010   VLDL NOT CALC 04/18/2013 1010   LDLCALC Comment:   Not calculated due to Triglyceride >400. Suggest ordering Direct LDL (Unit Code: 16109).   Total Cholesterol/HDL Ratio:CHD Risk                        Coronary Heart Disease Risk Table                                        Men       Women          1/2 Average Risk              3.4        3.3              Average Risk              5.0        4.4  2X Average Risk              9.6        7.1           3X Average Risk             23.4       11.0 Use the calculated Patient Ratio above and the CHD Risk table  to determine the patient's CHD Risk. ATP III Classification (LDL):       < 100        mg/dL         Optimal      161 - 129     mg/dL         Near or Above Optimal      130 - 159     mg/dL         Borderline High      160 - 189     mg/dL         High       > 096        mg/dL         Very High   0/45/4098 1010    Assessment: 42 yo with hx HIV who was recently changed from Atripla to stribild due to side effects. He showed me what pills he is taking. He is currently taking Stribild, valtrex, and fenofibrate. He has stopped taking fluconazole and septra. His CD4 is >200 now so I think that is ok. Dr. Drue Second said he can stop those. I made him a medication calendar in spanish.  Recommendations: Stribild 1 tab qday Valtrex 1g qday Fenofibrate 145mg  qda  [name], PharmD Clinical Infectious Disease Pharmacist [facility] 05/10/2013, 10:36 AM

## 2013-05-19 ENCOUNTER — Ambulatory Visit: Payer: Self-pay

## 2013-05-19 ENCOUNTER — Other Ambulatory Visit: Payer: Self-pay

## 2013-05-19 DIAGNOSIS — B20 Human immunodeficiency virus [HIV] disease: Secondary | ICD-10-CM

## 2013-05-19 DIAGNOSIS — B029 Zoster without complications: Secondary | ICD-10-CM

## 2013-05-19 MED ORDER — VALACYCLOVIR HCL 1 G PO TABS: 1000.0000 mg | ORAL_TABLET | Freq: Every day | ORAL | Status: DC

## 2013-06-16 ENCOUNTER — Ambulatory Visit (INDEPENDENT_AMBULATORY_CARE_PROVIDER_SITE_OTHER): Payer: Self-pay | Admitting: Internal Medicine

## 2013-06-16 ENCOUNTER — Encounter: Payer: Self-pay | Admitting: Internal Medicine

## 2013-06-16 VITALS — BP 109/74 | HR 69 | Temp 97.8°F | Wt 161.0 lb

## 2013-06-16 DIAGNOSIS — Z21 Asymptomatic human immunodeficiency virus [HIV] infection status: Secondary | ICD-10-CM

## 2013-06-16 DIAGNOSIS — E781 Pure hyperglyceridemia: Secondary | ICD-10-CM

## 2013-06-16 DIAGNOSIS — B20 Human immunodeficiency virus [HIV] disease: Secondary | ICD-10-CM

## 2013-06-16 NOTE — Progress Notes (Signed)
[facility] CLIN[facility] NOTE  RFV: routine Subjective:    Patient ID: Gerald Cox, male    DOB: [birth year], 42 y.o.   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  Doing well. Brought in pills. Met with travis, for adherence counseling  Still doing physical therapy for left knee injury. Sees a physician tomorrow for his knee.   Current Outpatient Prescriptions on File Prior to Visit  Medication Sig Dispense Refill  . elvitegravir-cobicistat-emtricitabine-tenofovir (STRIBILD) 150-150-200-300 MG TABS Take 1 tablet by mouth daily with breakfast.  30 tablet  11  . fenofibrate (TRICOR) 145 MG tablet Take 1 tablet (145 mg total) by mouth daily.  30 tablet  5  . valACYclovir (VALTREX) 1000 MG tablet Take 1 tablet (1,000 mg total) by mouth daily.  30 tablet  3   No current facility-administered medications on file prior to visit.   Active Ambulatory Problems    Diagnosis Date Noted  . HIV (human immunodeficiency virus infection) 10/27/2011  . Hyponatremia 10/27/2011  . 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 Diarrhea 11/07/2011  . Thrush 11/17/2011   Resolved Ambulatory Problems    Diagnosis Date Noted  . Sepsis associated hypotension 10/27/2011  . Acute respiratory failure 10/27/2011  . Septic shock 10/27/2011  . ARDS (adult respiratory distress syndrome) 10/27/2011   Past Medical History  Diagnosis Date  . Typhoid fever   . Laceration of hand with tendon involvement 2002  . Angina   . Pneumonia      Review of Systems 12 point review of system are reviewed and negative    Objective:   Physical Exam BP 109/74  Pulse 69  Temp(Src) 97.8 F (36.6 C) (Oral)  Wt 161 lb (73.029 kg)  BMI 26.99 kg/m2 Physical Exam  Constitutional: He is oriented to person, place, and time. He appears well-developed and well-nourished.  No distress.  HENT:  Mouth/Throat: Oropharynx is clear and moist. No oropharyngeal exudate.  Cardiovascular: Normal rate, regular rhythm and normal heart sounds. Exam reveals no gallop and no friction rub.  No murmur heard.  Pulmonary/Chest: Effort normal and breath sounds normal. No respiratory distress. He has no wheezes.  Abdominal: Soft. Bowel sounds are normal. He exhibits no distension. There is no tenderness.  Lymphadenopathy:  He has no cervical adenopathy.  Neurological: He is alert and oriented to person, place, and time.  Skin: Skin is warm and dry. No rash noted. No erythema.  Psychiatric: He has a normal mood and affect. His behavior is normal.  Ext: left knee wrapped      Assessment & Plan:  HIV = continue with stribild. Is having great adherence thus far. return in 3 month, labs 2 wk prior  HSV prophylaxis = continue with valtrex daily  HyperTG= continue on fenofibrate, check lipid profile at next visit

## 2013-08-06 IMAGING — CR DG KNEE COMPLETE 4+V*L*
4 series · 4 of 4 positions shown · non-contrast
Comparison: None.

CLINICAL DATA: Knee pain and swelling.

LEFT KNEE - COMPLETE 4+ VIEW

[t knee ap left]
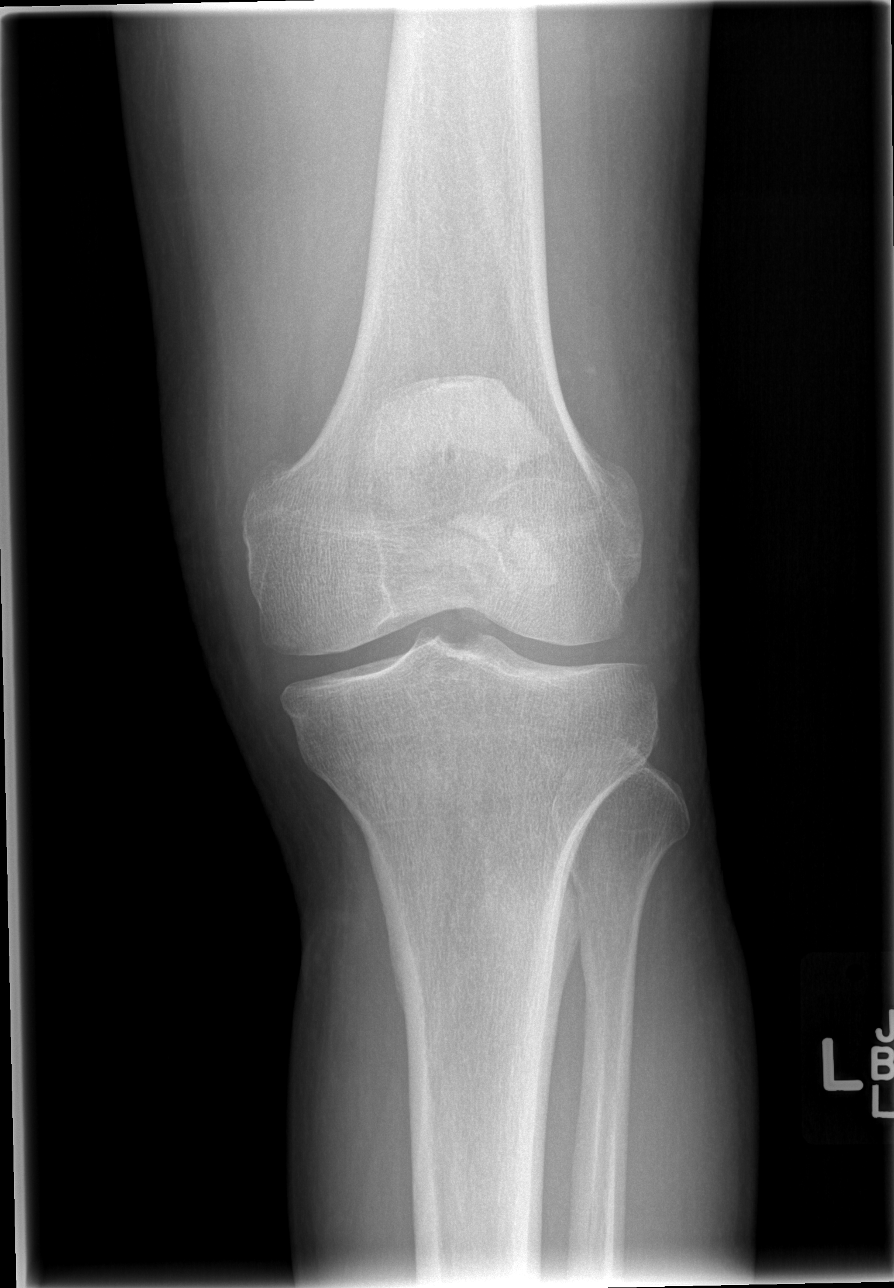

[t knee oblique left (1 of 2)]
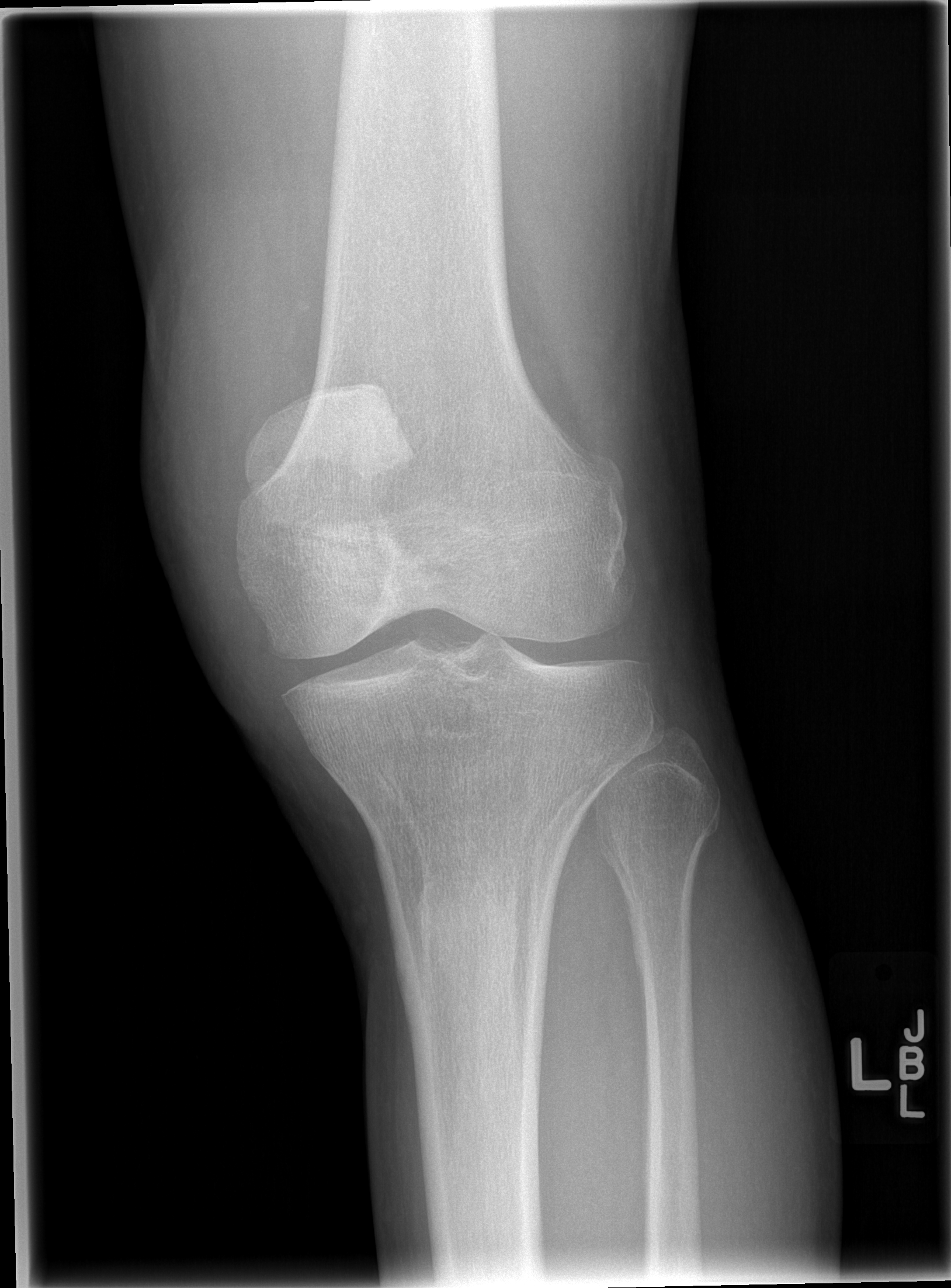

[t knee oblique left (2 of 2)]
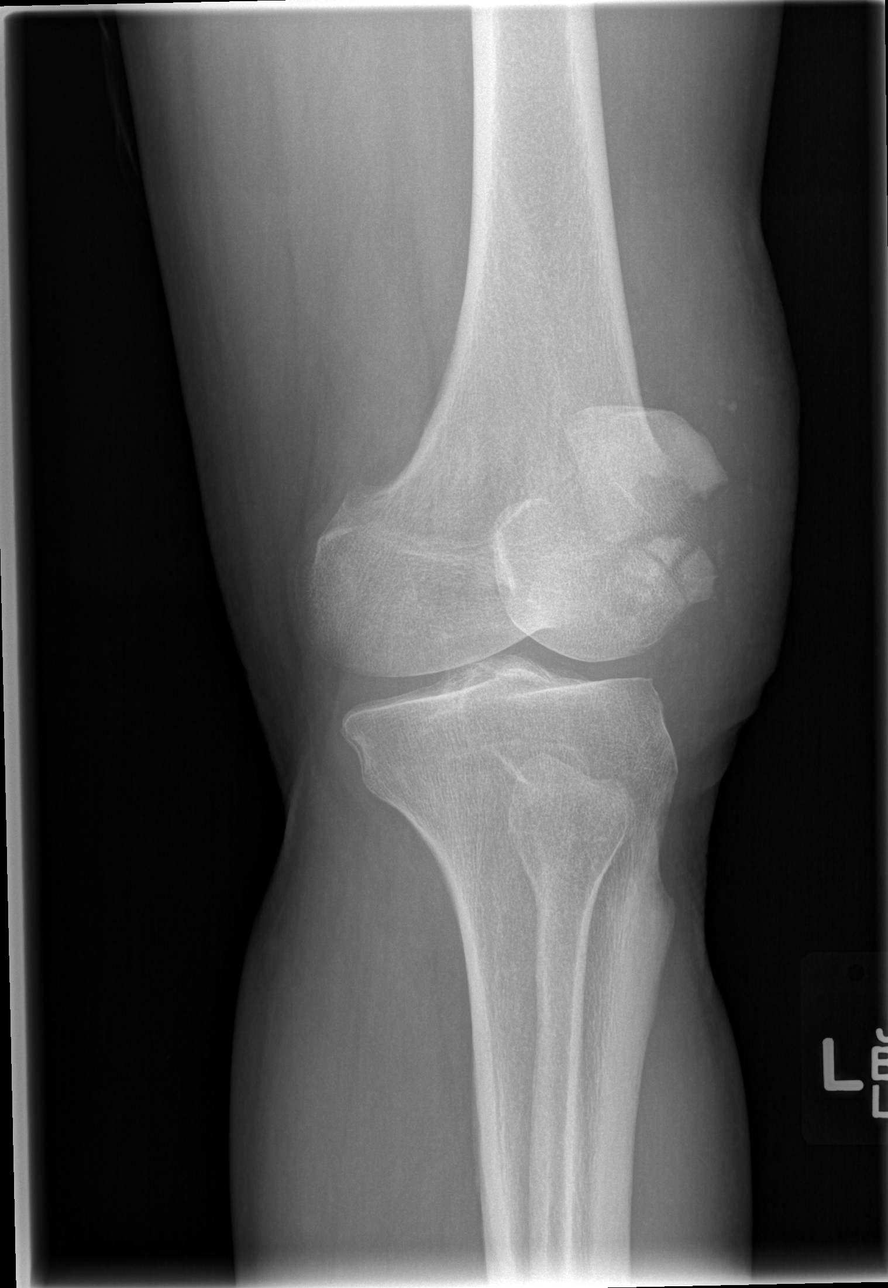

[t knee lat left]
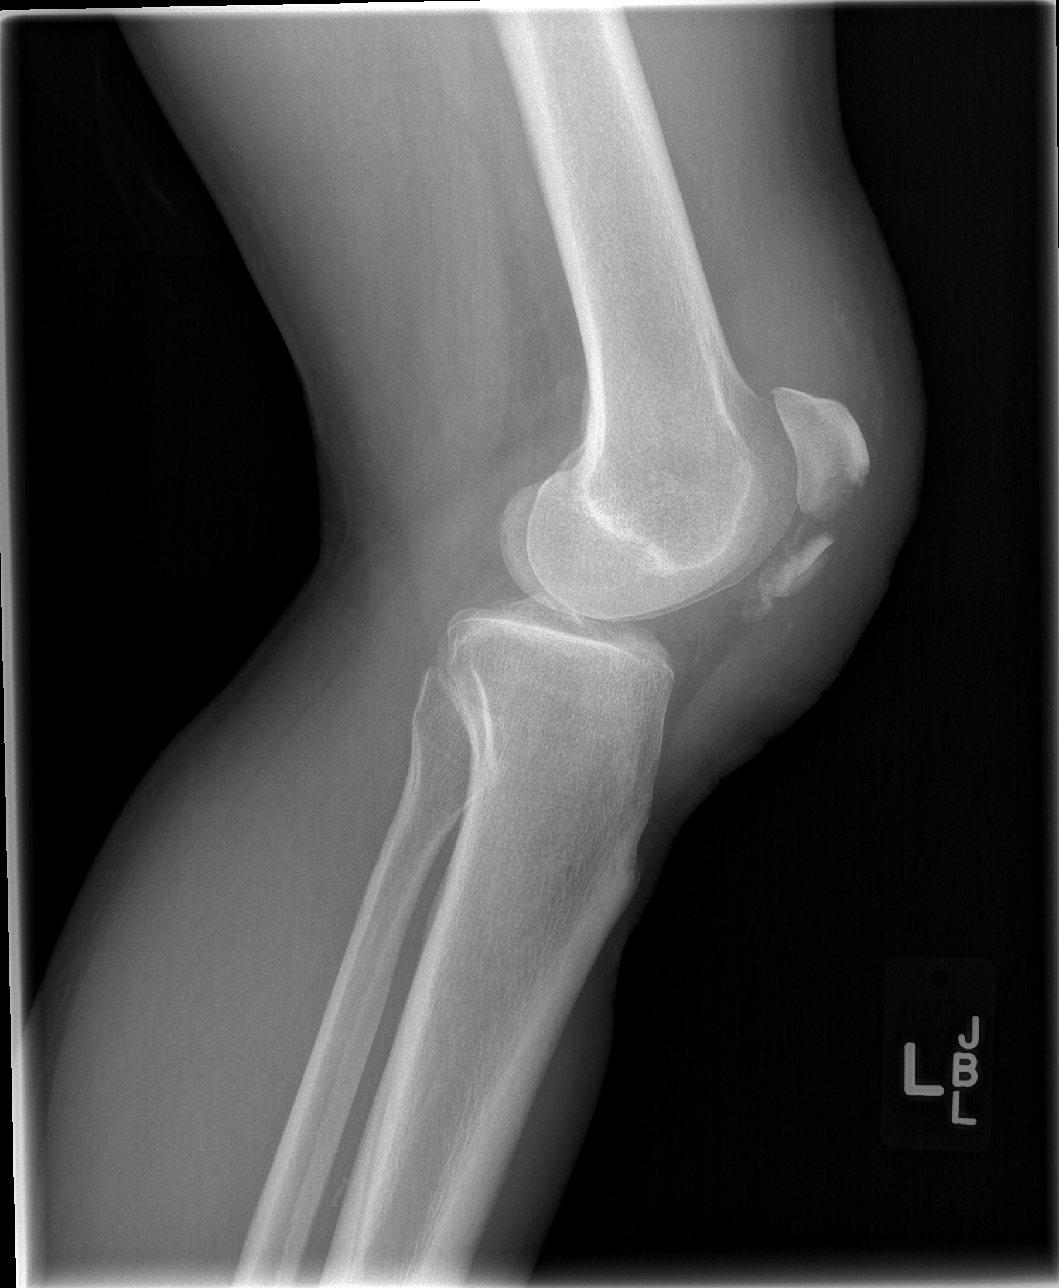

[4 of 4 positions shown; findings below may reference images not displayed]

FINDINGS: A comminuted fracture of the patella is seen with marked.
Patellar soft tissue swelling.  A small knee joint effusion cannot
be excluded given the extent of the prepatellar soft tissue
swelling.

No other fracture identified.  No evidence of dislocation.
IMPRESSION: Comminuted patellar fracture with associated prepatellar soft
tissue swelling.

## 2013-08-10 ENCOUNTER — Ambulatory Visit: Payer: Self-pay

## 2013-09-20 ENCOUNTER — Other Ambulatory Visit: Payer: Self-pay

## 2013-09-20 DIAGNOSIS — B20 Human immunodeficiency virus [HIV] disease: Secondary | ICD-10-CM

## 2013-09-20 LAB — CBC WITH DIFFERENTIAL/PLATELET
Basophils Absolute: 0 10*3/uL (ref 0.0–0.1)
HCT: 45.7 % (ref 39.0–52.0)
Lymphocytes Relative: 48 % — ABNORMAL HIGH (ref 12–46)
Monocytes Absolute: 0.4 10*3/uL (ref 0.1–1.0)
Neutro Abs: 2 10*3/uL (ref 1.7–7.7)
Neutrophils Relative %: 41 % — ABNORMAL LOW (ref 43–77)
RDW: 14.2 % (ref 11.5–15.5)
WBC: 4.8 10*3/uL (ref 4.0–10.5)

## 2013-09-20 LAB — COMPREHENSIVE METABOLIC PANEL
ALT: 31 U/L (ref 0–53)
AST: 26 U/L (ref 0–37)
Albumin: 4.4 g/dL (ref 3.5–5.2)
BUN: 13 mg/dL (ref 6–23)
Calcium: 9.7 mg/dL (ref 8.4–10.5)
Chloride: 103 mEq/L (ref 96–112)
Potassium: 4.3 mEq/L (ref 3.5–5.3)
Sodium: 138 mEq/L (ref 135–145)
Total Protein: 7.8 g/dL (ref 6.0–8.3)

## 2013-09-26 ENCOUNTER — Other Ambulatory Visit: Payer: Self-pay | Admitting: *Deleted

## 2013-09-26 DIAGNOSIS — B20 Human immunodeficiency virus [HIV] disease: Secondary | ICD-10-CM

## 2013-09-26 DIAGNOSIS — B029 Zoster without complications: Secondary | ICD-10-CM

## 2013-09-26 MED ORDER — VALACYCLOVIR HCL 1 G PO TABS: 1000.0000 mg | ORAL_TABLET | Freq: Every day | ORAL | Status: DC

## 2013-10-04 ENCOUNTER — Encounter: Payer: Self-pay | Admitting: Internal Medicine

## 2013-10-04 ENCOUNTER — Ambulatory Visit (INDEPENDENT_AMBULATORY_CARE_PROVIDER_SITE_OTHER): Payer: Self-pay | Admitting: Internal Medicine

## 2013-10-04 VITALS — BP 126/87 | HR 90 | Temp 98.3°F | Wt 165.0 lb

## 2013-10-04 DIAGNOSIS — Z23 Encounter for immunization: Secondary | ICD-10-CM

## 2013-10-04 NOTE — Progress Notes (Signed)
[facility] CL[facility] NOTE  RFV: routine Subjective:    Patient ID: Gerald Cox, male    DOB: [birth year], 42 y.o.   MRN: [id number]  HPI aland is a 42yo M, with CD 4 count 210/VL <20, on stribild. Also has HTG of 514. hiv dx in setting of PCP pneumonia. He states that he is doing well with stribild. No missing doses. Doing well overall.  Current Outpatient Prescriptions on File Prior to Visit  Medication Sig Dispense Refill  . elvitegravir-cobicistat-emtricitabine-tenofovir (STRIBILD) 150-150-200-300 MG TABS Take 1 tablet by mouth daily with breakfast.  30 tablet  11  . fenofibrate (TRICOR) 145 MG tablet Take 1 tablet (145 mg total) by mouth daily.  30 tablet  5  . valACYclovir (VALTREX) 1000 MG tablet Take 1 tablet (1,000 mg total) by mouth daily.  30 tablet  5   No current facility-administered medications on file prior to visit.   Active Ambulatory Problems    Diagnosis Date Noted  . HIV (human immunodeficiency virus infection) 10/27/2011  . Hyponatremia 10/27/2011  . 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 Diarrhea 11/07/2011  . Thrush 11/17/2011   Resolved Ambulatory Problems    Diagnosis Date Noted  . Sepsis associated hypotension 10/27/2011  . Acute respiratory failure 10/27/2011  . Septic shock 10/27/2011  . ARDS (adult respiratory distress syndrome) 10/27/2011   Past Medical History  Diagnosis Date  . Typhoid fever   . Laceration of hand with tendon involvement 2002  . Angina   . Pneumonia        Review of Systems  Constitutional: Negative for fever, chills, diaphoresis, activity change, appetite change, fatigue and unexpected weight change.  HENT: Negative for congestion, sore throat, rhinorrhea, sneezing, trouble swallowing and sinus pressure.  Eyes: Negative for photophobia and visual disturbance.  Respiratory: Negative for cough, chest tightness, shortness of breath, wheezing and stridor.  Cardiovascular: Negative for chest pain, palpitations and  leg swelling.  Gastrointestinal: Negative for nausea, vomiting, abdominal pain, diarrhea, constipation, blood in stool, abdominal distention and anal bleeding.  Genitourinary: Negative for dysuria, hematuria, flank pain and difficulty urinating.  Musculoskeletal: Negative for myalgias, back pain, joint swelling, arthralgias and gait problem.  Skin: Negative for color change, pallor, rash and wound.  Neurological: Negative for dizziness, tremors, weakness and light-headedness.  Hematological: Negative for adenopathy. Does not bruise/bleed easily.  Psychiatric/Behavioral: Negative for behavioral problems, confusion, sleep disturbance, dysphoric mood, decreased concentration and agitation.       Objective:   Physical Exam BP 126/87  Pulse 90  Temp(Src) 98.3 F (36.8 C) (Oral)  Wt 165 lb (74.844 kg)  BMI 27.66 kg/m2 Physical Exam  Constitutional: He is oriented to person, place, and time. He appears well-developed and well-nourished. No distress.  HENT:  Mouth/Throat: Oropharynx is clear and moist. No oropharyngeal exudate.  Cardiovascular: Normal rate, regular rhythm and normal heart sounds. Exam reveals no gallop and no friction rub.  No murmur heard.  Pulmonary/Chest: Effort normal and breath sounds normal. No respiratory distress. He has no wheezes.  Abdominal: Soft. Bowel sounds are normal. He exhibits no distension. There is no tenderness.  Lymphadenopathy:  He has no cervical adenopathy.  Neurological: He is alert and oriented to person, place, and time.  Skin: Skin is warm and dry. No rash noted. No erythema.  Psychiatric: He has a normal mood and affect. His behavior is normal.          Assessment & Plan:  hiv = continue with stribild  hsv proph = continue with  valtrex  htg = will continue with fenofibrate and recheck lipids at next visit  Health maintenance =will get flu shot today  rtc in 3 months

## 2013-10-27 ENCOUNTER — Other Ambulatory Visit: Payer: Self-pay | Admitting: *Deleted

## 2013-10-27 DIAGNOSIS — E781 Pure hyperglyceridemia: Secondary | ICD-10-CM

## 2013-10-27 MED ORDER — FENOFIBRATE 145 MG PO TABS: 145.0000 mg | ORAL_TABLET | Freq: Every day | ORAL | Status: DC

## 2013-12-28 ENCOUNTER — Other Ambulatory Visit (INDEPENDENT_AMBULATORY_CARE_PROVIDER_SITE_OTHER): Payer: Self-pay

## 2013-12-28 ENCOUNTER — Other Ambulatory Visit: Payer: Self-pay

## 2013-12-28 DIAGNOSIS — B20 Human immunodeficiency virus [HIV] disease: Secondary | ICD-10-CM

## 2013-12-28 DIAGNOSIS — Z113 Encounter for screening for infections with a predominantly sexual mode of transmission: Secondary | ICD-10-CM

## 2013-12-28 LAB — COMPLETE METABOLIC PANEL WITH GFR
ALBUMIN: 4.3 g/dL (ref 3.5–5.2)
ALT: 33 U/L (ref 0–53)
AST: 35 U/L (ref 0–37)
Alkaline Phosphatase: 55 U/L (ref 39–117)
BUN: 9 mg/dL (ref 6–23)
CALCIUM: 9.4 mg/dL (ref 8.4–10.5)
CHLORIDE: 103 meq/L (ref 96–112)
CO2: 28 meq/L (ref 19–32)
Creat: 1.04 mg/dL (ref 0.50–1.35)
GFR, EST NON AFRICAN AMERICAN: 88 mL/min
GLUCOSE: 84 mg/dL (ref 70–99)
POTASSIUM: 4.5 meq/L (ref 3.5–5.3)
SODIUM: 136 meq/L (ref 135–145)
TOTAL PROTEIN: 7.7 g/dL (ref 6.0–8.3)
Total Bilirubin: 0.5 mg/dL (ref 0.3–1.2)

## 2013-12-28 LAB — CBC WITH DIFFERENTIAL/PLATELET
BASOS ABS: 0 10*3/uL (ref 0.0–0.1)
Basophils Relative: 1 % (ref 0–1)
EOS PCT: 3 % (ref 0–5)
Eosinophils Absolute: 0.1 10*3/uL (ref 0.0–0.7)
HCT: 45.8 % (ref 39.0–52.0)
Hemoglobin: 16.4 g/dL (ref 13.0–17.0)
LYMPHS PCT: 45 % (ref 12–46)
Lymphs Abs: 1.5 10*3/uL (ref 0.7–4.0)
MCH: 33.7 pg (ref 26.0–34.0)
MCHC: 35.8 g/dL (ref 30.0–36.0)
MCV: 94.2 fL (ref 78.0–100.0)
MONO ABS: 0.4 10*3/uL (ref 0.1–1.0)
Monocytes Relative: 12 % (ref 3–12)
Neutro Abs: 1.4 10*3/uL — ABNORMAL LOW (ref 1.7–7.7)
Neutrophils Relative %: 39 % — ABNORMAL LOW (ref 43–77)
PLATELETS: 269 10*3/uL (ref 150–400)
RBC: 4.86 MIL/uL (ref 4.22–5.81)
RDW: 14.2 % (ref 11.5–15.5)
WBC: 3.4 10*3/uL — ABNORMAL LOW (ref 4.0–10.5)

## 2013-12-29 LAB — T-HELPER CELL (CD4) - (RCID CLINIC ONLY)
CD4 % Helper T Cell: 11 % — ABNORMAL LOW (ref 33–55)
CD4 T Cell Abs: 170 /uL — ABNORMAL LOW (ref 400–2700)

## 2013-12-30 LAB — HIV-1 RNA QUANT-NO REFLEX-BLD
HIV 1 RNA Quant: <20 copies/mL (ref <20)
HIV-1 RNA Quant, Log: <1.3 {Log} (ref <1.30)

## 2014-01-11 ENCOUNTER — Encounter: Payer: Self-pay | Admitting: Infectious Diseases

## 2014-01-11 ENCOUNTER — Ambulatory Visit (INDEPENDENT_AMBULATORY_CARE_PROVIDER_SITE_OTHER): Payer: Self-pay | Admitting: Infectious Diseases

## 2014-01-11 VITALS — BP 133/89 | HR 61 | Temp 98.5°F | Ht 64.5 in | Wt 161.0 lb

## 2014-01-11 DIAGNOSIS — E785 Hyperlipidemia, unspecified: Secondary | ICD-10-CM

## 2014-01-11 DIAGNOSIS — Z113 Encounter for screening for infections with a predominantly sexual mode of transmission: Secondary | ICD-10-CM

## 2014-01-11 DIAGNOSIS — Z720 Tobacco use: Secondary | ICD-10-CM

## 2014-01-11 DIAGNOSIS — B20 Human immunodeficiency virus [HIV] disease: Secondary | ICD-10-CM

## 2014-01-11 DIAGNOSIS — Z21 Asymptomatic human immunodeficiency virus [HIV] infection status: Secondary | ICD-10-CM

## 2014-01-11 DIAGNOSIS — F17200 Nicotine dependence, unspecified, uncomplicated: Secondary | ICD-10-CM

## 2014-01-11 DIAGNOSIS — Z23 Encounter for immunization: Secondary | ICD-10-CM

## 2014-01-11 NOTE — Progress Notes (Signed)
   Subjective:    Patient ID: Armandina StammerSamuel Doval-Rivera, male    DOB: Aug 04, 1971, 43 y.o.   MRN: [id number]  HPI 43 yo M with hx of HIV+, originally from [place], previously [place] then switched to stribild May 2014 due to high triglycerides. He was also started on fenofibrate.  Has been doing well- missed meds. 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    Review of Systems  Constitutional: Negative for appetite change and unexpected weight change.  Gastrointestinal: Negative for diarrhea and constipation.  Genitourinary: Negative for difficulty urinating.  Musculoskeletal: Positive for back pain.       Objective:   Physical Exam  Constitutional: He appears well-developed and well-nourished.  HENT:  Mouth/Throat: No oropharyngeal exudate.  Eyes: EOM are normal. Pupils are equal, round, and reactive to light.  Neck: Neck supple.  Cardiovascular: Normal rate, regular rhythm and normal heart sounds.   Pulmonary/Chest: Effort normal and breath sounds normal.  Abdominal: Soft. Bowel sounds are normal. He exhibits no distension. There is no tenderness.  Lymphadenopathy:    He has no cervical adenopathy.          Assessment & Plan:

## 2014-01-11 NOTE — Assessment & Plan Note (Signed)
States he has been taking his fenofibrate. He needs to have his lipids repeated. Will not repeat them today, will do them at f/u when he is fasting.

## 2014-01-11 NOTE — Assessment & Plan Note (Signed)
Encouraged pt to stop smoking.

## 2014-01-11 NOTE — Addendum Note (Signed)
Addended by: Wendall MolaOCKERHAM, JACQUELINE A on: 01/11/2014 10:37 AM   Modules accepted: Orders

## 2014-01-11 NOTE — Assessment & Plan Note (Addendum)
He has dropped his CD4# but he also recounts that he was ill around his blood draw. He needs to start  Hep B series. He is given condoms today. Will see hm back in 6 months. Will not start him on bactrim today. Will screen him to see if he needs Hep A vax.

## 2014-02-13 ENCOUNTER — Ambulatory Visit: Payer: Self-pay

## 2014-02-21 ENCOUNTER — Other Ambulatory Visit: Payer: Self-pay | Admitting: Licensed Clinical Social Worker

## 2014-02-21 DIAGNOSIS — B029 Zoster without complications: Secondary | ICD-10-CM

## 2014-02-21 DIAGNOSIS — B20 Human immunodeficiency virus [HIV] disease: Secondary | ICD-10-CM

## 2014-02-21 MED ORDER — VALACYCLOVIR HCL 1 G PO TABS
1000.0000 mg | ORAL_TABLET | Freq: Every day | ORAL | Status: DC
Start: 2014-02-21 — End: 2014-07-15

## 2014-04-26 ENCOUNTER — Encounter: Payer: Self-pay | Admitting: Licensed Clinical Social Worker

## 2014-04-26 ENCOUNTER — Other Ambulatory Visit: Payer: Self-pay | Admitting: Internal Medicine

## 2014-04-26 NOTE — Progress Notes (Signed)
CM-Pari Lombard assigned to CT until 04/2014

## 2014-05-08 ENCOUNTER — Ambulatory Visit: Payer: Self-pay

## 2014-05-23 ENCOUNTER — Other Ambulatory Visit: Payer: Self-pay | Admitting: *Deleted

## 2014-05-23 ENCOUNTER — Other Ambulatory Visit: Payer: Self-pay | Admitting: Infectious Diseases

## 2014-05-23 DIAGNOSIS — E785 Hyperlipidemia, unspecified: Secondary | ICD-10-CM

## 2014-05-23 DIAGNOSIS — B20 Human immunodeficiency virus [HIV] disease: Secondary | ICD-10-CM

## 2014-05-23 MED ORDER — FENOFIBRATE 145 MG PO TABS: ORAL_TABLET | ORAL | Status: DC

## 2014-05-23 MED ORDER — ELVITEG-COBIC-EMTRICIT-TENOFDF 150-150-200-300 MG PO TABS
ORAL_TABLET | ORAL | Status: DC
Start: 2014-05-23 — End: 2014-07-12

## 2014-06-27 ENCOUNTER — Other Ambulatory Visit: Payer: Self-pay

## 2014-07-12 ENCOUNTER — Encounter: Payer: Self-pay | Admitting: Infectious Disease

## 2014-07-12 ENCOUNTER — Ambulatory Visit (INDEPENDENT_AMBULATORY_CARE_PROVIDER_SITE_OTHER): Payer: Self-pay | Admitting: Infectious Disease

## 2014-07-12 VITALS — BP 131/86 | HR 88 | Temp 98.5°F | Wt 160.5 lb

## 2014-07-12 DIAGNOSIS — E781 Pure hyperglyceridemia: Secondary | ICD-10-CM

## 2014-07-12 DIAGNOSIS — Z21 Asymptomatic human immunodeficiency virus [HIV] infection status: Secondary | ICD-10-CM

## 2014-07-12 DIAGNOSIS — Z113 Encounter for screening for infections with a predominantly sexual mode of transmission: Secondary | ICD-10-CM

## 2014-07-12 DIAGNOSIS — Z79899 Other long term (current) drug therapy: Secondary | ICD-10-CM

## 2014-07-12 DIAGNOSIS — B20 Human immunodeficiency virus [HIV] disease: Secondary | ICD-10-CM

## 2014-07-12 DIAGNOSIS — Z23 Encounter for immunization: Secondary | ICD-10-CM

## 2014-07-12 LAB — COMPLETE METABOLIC PANEL WITH GFR
ALBUMIN: 4.1 g/dL (ref 3.5–5.2)
ALT: 23 U/L (ref 0–53)
AST: 27 U/L (ref 0–37)
Alkaline Phosphatase: 58 U/L (ref 39–117)
BILIRUBIN TOTAL: 0.6 mg/dL (ref 0.2–1.2)
BUN: 13 mg/dL (ref 6–23)
CO2: 26 mEq/L (ref 19–32)
Calcium: 9.2 mg/dL (ref 8.4–10.5)
Chloride: 103 mEq/L (ref 96–112)
Creat: 1.03 mg/dL (ref 0.50–1.35)
GFR, EST NON AFRICAN AMERICAN: 89 mL/min
GFR, Est African American: >89 mL/min
GLUCOSE: 99 mg/dL (ref 70–99)
Potassium: 4.6 mEq/L (ref 3.5–5.3)
SODIUM: 136 meq/L (ref 135–145)
TOTAL PROTEIN: 7.6 g/dL (ref 6.0–8.3)

## 2014-07-12 LAB — CBC WITH DIFFERENTIAL/PLATELET
BASOS PCT: 0 % (ref 0–1)
Basophils Absolute: 0 10*3/uL (ref 0.0–0.1)
EOS PCT: 3 % (ref 0–5)
Eosinophils Absolute: 0.1 10*3/uL (ref 0.0–0.7)
HEMATOCRIT: 45.4 % (ref 39.0–52.0)
HEMOGLOBIN: 16.2 g/dL (ref 13.0–17.0)
Lymphocytes Relative: 21 % (ref 12–46)
Lymphs Abs: 0.9 10*3/uL (ref 0.7–4.0)
MCH: 34.7 pg — ABNORMAL HIGH (ref 26.0–34.0)
MCHC: 35.7 g/dL (ref 30.0–36.0)
MCV: 97.2 fL (ref 78.0–100.0)
MONO ABS: 0.5 10*3/uL (ref 0.1–1.0)
MONOS PCT: 10 % (ref 3–12)
NEUTROS ABS: 3 10*3/uL (ref 1.7–7.7)
Neutrophils Relative %: 66 % (ref 43–77)
Platelets: 281 10*3/uL (ref 150–400)
RBC: 4.67 MIL/uL (ref 4.22–5.81)
RDW: 13.5 % (ref 11.5–15.5)
WBC: 4.5 10*3/uL (ref 4.0–10.5)

## 2014-07-12 LAB — LIPID PANEL
Cholesterol: 197 mg/dL (ref 0–200)
HDL: 65 mg/dL (ref >39)
LDL CALC: 112 mg/dL — AB (ref 0–99)
Total CHOL/HDL Ratio: 3 Ratio
Triglycerides: 98 mg/dL (ref <150)
VLDL: 20 mg/dL (ref 0–40)

## 2014-07-12 LAB — RPR

## 2014-07-12 MED ORDER — ELVITEG-COBIC-EMTRICIT-TENOFDF 150-150-200-300 MG PO TABS: ORAL_TABLET | ORAL | Status: DC

## 2014-07-12 NOTE — Progress Notes (Signed)
   Subjective:    Patient ID: Gerald Cox, male    DOB: [birth year], 43 y.o.   MRN: [id number]  HPI  Gerald Cox is a 43 y.o. male with HIV infection who is doing superbly well on his  antiviral regimen, STRIBILD  with undetectable viral load and health cd4 count.   Lab Results  Component Value Date   HIV1RNAQUANT <20 12/28/2013    Lab Results  Component Value Date   CD4TABS 170* 12/28/2013   CD4TABS 210* 09/20/2013   CD4TABS 270* 04/18/2013       Review of Systems  Constitutional: Negative for fever, chills, diaphoresis, activity change, appetite change, fatigue and unexpected weight change.  HENT: Negative for congestion, rhinorrhea, sinus pressure, sneezing, sore throat and trouble swallowing.   Eyes: Negative for photophobia and visual disturbance.  Respiratory: Negative for cough, chest tightness, shortness of breath, wheezing and stridor.   Cardiovascular: Negative for chest pain, palpitations and leg swelling.  Gastrointestinal: Negative for nausea, vomiting, abdominal pain, diarrhea, constipation, blood in stool, abdominal distention and anal bleeding.  Genitourinary: Negative for dysuria, hematuria, flank pain and difficulty urinating.  Musculoskeletal: Negative for arthralgias, back pain, gait problem, joint swelling and myalgias.  Skin: Negative for color change, pallor, rash and wound.  Neurological: Negative for dizziness, tremors, weakness and light-headedness.  Hematological: Negative for adenopathy. Does not bruise/bleed easily.  Psychiatric/Behavioral: Negative for behavioral problems, confusion, sleep disturbance, dysphoric mood, decreased concentration and agitation.       Objective:   Physical Exam  Constitutional: He is oriented to person, place, and time. He appears well-developed and well-nourished. No distress.  HENT:  Head: Normocephalic and atraumatic.  Mouth/Throat: Oropharynx is clear and moist. No oropharyngeal exudate.  Eyes:  Conjunctivae and EOM are normal. No scleral icterus.  Neck: Normal range of motion. Neck supple. No JVD present.  Cardiovascular: Normal rate and regular rhythm.   Pulmonary/Chest: Effort normal. No respiratory distress. He has no wheezes.  Abdominal: He exhibits no distension.  Musculoskeletal: He exhibits no edema and no tenderness.  Neurological: He is alert and oriented to person, place, and time. He exhibits normal muscle tone. Coordination normal.  Skin: Skin is warm and dry. He is not diaphoretic. No erythema. No pallor.  Psychiatric: He has a normal mood and affect. His behavior is normal. Judgment and thought content normal.          Assessment & Plan:   #1 HIV: Re\re check HIV viral load and CD4 count today he is renewed ADAP paper work with [name].  Check for STDs routine labs he can then return to clinic in 6 months.  I spent greater than 25 minutes with the patient including greater than 50% of time in face to face counsel of the patient and in coordination of their care.   #2 elevated triglycerides continue fenofibrate  #3 Vaccinate for Hep B #2 and check Hep ab

## 2014-07-13 LAB — T-HELPER CELL (CD4) - (RCID CLINIC ONLY)
CD4 T CELL HELPER: 13 % — AB (ref 33–55)
CD4 T Cell Abs: 120 /uL — ABNORMAL LOW (ref 400–2700)

## 2014-07-13 LAB — HEPATITIS A ANTIBODY, TOTAL: Hep A Total Ab: REACTIVE — AB

## 2014-07-15 ENCOUNTER — Other Ambulatory Visit: Payer: Self-pay | Admitting: Internal Medicine

## 2014-07-15 LAB — HIV-1 RNA QUANT-NO REFLEX-BLD
HIV 1 RNA Quant: <20 copies/mL (ref <20)
HIV-1 RNA Quant, Log: <1.3 {Log} (ref <1.30)

## 2014-07-17 LAB — HLA B*5701: HLA-B*5701 w/rflx HLA-B High: NEGATIVE

## 2014-12-28 ENCOUNTER — Other Ambulatory Visit (INDEPENDENT_AMBULATORY_CARE_PROVIDER_SITE_OTHER): Payer: Self-pay

## 2014-12-28 DIAGNOSIS — E785 Hyperlipidemia, unspecified: Secondary | ICD-10-CM

## 2014-12-28 DIAGNOSIS — Z113 Encounter for screening for infections with a predominantly sexual mode of transmission: Secondary | ICD-10-CM

## 2014-12-28 DIAGNOSIS — B20 Human immunodeficiency virus [HIV] disease: Secondary | ICD-10-CM

## 2014-12-28 LAB — CBC WITH DIFFERENTIAL/PLATELET
BASOS ABS: 0 10*3/uL (ref 0.0–0.1)
Basophils Relative: 1 % (ref 0–1)
EOS ABS: 0.1 10*3/uL (ref 0.0–0.7)
Eosinophils Relative: 2 % (ref 0–5)
HCT: 46.9 % (ref 39.0–52.0)
HEMOGLOBIN: 16.4 g/dL (ref 13.0–17.0)
LYMPHS ABS: 1.2 10*3/uL (ref 0.7–4.0)
Lymphocytes Relative: 26 % (ref 12–46)
MCH: 34 pg (ref 26.0–34.0)
MCHC: 35 g/dL (ref 30.0–36.0)
MCV: 97.3 fL (ref 78.0–100.0)
MPV: 9.5 fL (ref 8.6–12.4)
Monocytes Absolute: 0.6 10*3/uL (ref 0.1–1.0)
Monocytes Relative: 13 % — ABNORMAL HIGH (ref 3–12)
Neutro Abs: 2.6 10*3/uL (ref 1.7–7.7)
Neutrophils Relative %: 58 % (ref 43–77)
Platelets: 296 10*3/uL (ref 150–400)
RBC: 4.82 MIL/uL (ref 4.22–5.81)
RDW: 12.8 % (ref 11.5–15.5)
WBC: 4.5 10*3/uL (ref 4.0–10.5)

## 2014-12-28 LAB — COMPLETE METABOLIC PANEL WITH GFR
ALT: 26 U/L (ref 0–53)
AST: 29 U/L (ref 0–37)
Albumin: 4.1 g/dL (ref 3.5–5.2)
Alkaline Phosphatase: 53 U/L (ref 39–117)
BILIRUBIN TOTAL: 0.5 mg/dL (ref 0.2–1.2)
BUN: 11 mg/dL (ref 6–23)
CHLORIDE: 104 meq/L (ref 96–112)
CO2: 26 meq/L (ref 19–32)
CREATININE: 1.09 mg/dL (ref 0.50–1.35)
Calcium: 9.5 mg/dL (ref 8.4–10.5)
GFR, EST NON AFRICAN AMERICAN: 82 mL/min
Glucose, Bld: 85 mg/dL (ref 70–99)
Potassium: 4.8 mEq/L (ref 3.5–5.3)
Sodium: 136 mEq/L (ref 135–145)
Total Protein: 7.6 g/dL (ref 6.0–8.3)

## 2014-12-28 LAB — LIPID PANEL
Cholesterol: 163 mg/dL (ref 0–200)
HDL: 44 mg/dL (ref >39)
LDL CALC: 98 mg/dL (ref 0–99)
Total CHOL/HDL Ratio: 3.7 Ratio
Triglycerides: 105 mg/dL (ref <150)
VLDL: 21 mg/dL (ref 0–40)

## 2014-12-28 LAB — HEPATITIS A ANTIBODY, TOTAL: Hep A Total Ab: REACTIVE — AB

## 2014-12-29 LAB — HIV-1 RNA QUANT-NO REFLEX-BLD
HIV 1 RNA QUANT: 21 {copies}/mL (ref <20)
HIV-1 RNA Quant, Log: 1.32 {Log} — ABNORMAL HIGH (ref <1.30)

## 2014-12-29 LAB — T-HELPER CELL (CD4) - (RCID CLINIC ONLY)
CD4 % Helper T Cell: 12 % — ABNORMAL LOW (ref 33–55)
CD4 T CELL ABS: 150 /uL — AB (ref 400–2700)

## 2014-12-29 LAB — URINE CYTOLOGY ANCILLARY ONLY
Chlamydia: NEGATIVE
NEISSERIA GONORRHEA: NEGATIVE

## 2014-12-29 LAB — RPR

## 2015-01-02 ENCOUNTER — Other Ambulatory Visit: Payer: Self-pay | Admitting: Infectious Diseases

## 2015-01-02 DIAGNOSIS — E781 Pure hyperglyceridemia: Secondary | ICD-10-CM

## 2015-01-03 ENCOUNTER — Other Ambulatory Visit: Payer: Self-pay | Admitting: *Deleted

## 2015-01-03 DIAGNOSIS — A6000 Herpesviral infection of urogenital system, unspecified: Secondary | ICD-10-CM

## 2015-01-03 MED ORDER — VALACYCLOVIR HCL 1 G PO TABS: 1000.0000 mg | ORAL_TABLET | Freq: Every day | ORAL | Status: AC

## 2015-01-03 NOTE — Telephone Encounter (Signed)
Originally diagnosed 12/03/2011 by Dr. Drue SecondSnider.

## 2015-01-11 ENCOUNTER — Encounter: Payer: Self-pay | Admitting: Infectious Disease

## 2015-01-11 ENCOUNTER — Ambulatory Visit (INDEPENDENT_AMBULATORY_CARE_PROVIDER_SITE_OTHER): Payer: Self-pay | Admitting: Infectious Disease

## 2015-01-11 VITALS — BP 112/72 | HR 82 | Temp 98.0°F | Wt 156.0 lb

## 2015-01-11 DIAGNOSIS — A6000 Herpesviral infection of urogenital system, unspecified: Secondary | ICD-10-CM

## 2015-01-11 DIAGNOSIS — B20 Human immunodeficiency virus [HIV] disease: Secondary | ICD-10-CM

## 2015-01-11 DIAGNOSIS — E785 Hyperlipidemia, unspecified: Secondary | ICD-10-CM

## 2015-01-11 DIAGNOSIS — Z21 Asymptomatic human immunodeficiency virus [HIV] infection status: Secondary | ICD-10-CM

## 2015-01-11 DIAGNOSIS — Z23 Encounter for immunization: Secondary | ICD-10-CM

## 2015-01-11 MED ORDER — ELVITEG-COBIC-EMTRICIT-TENOFAF 150-150-200-10 MG PO TABS: 1.0000 | ORAL_TABLET | Freq: Every day | ORAL | Status: DC

## 2015-01-11 NOTE — Patient Instructions (Signed)
Thank you for taking such great care of yourself  Make an appt with Gerald Cox re ADAP renewal today  I am changing your medicine from [facility] to [facility] it is a BETTER VERSION OF THE SAME MEDICINE to be taken with food

## 2015-01-11 NOTE — Progress Notes (Signed)
   Subjective:    Patient ID: Gerald Cox, male    DOB: 1971-06-25, 44 y.o.   MRN: [id number]  HPI   Gerald Cox is a 44 y.o. male with HIV infection (with history of PCP pneumonia, typhoid fever)  who is doing superbly well on his  antiviral regimen, STRIBILD with undetectable viral load and health cd4 count.   Lab Results  Component Value Date   HIV1RNAQUANT 21 12/28/2014    Lab Results  Component Value Date   CD4TABS 150* 12/28/2014   CD4TABS 120* 07/12/2014   CD4TABS 170* 12/28/2013    He has no complaints tonight    Review of Systems  Constitutional: Negative for fever, chills, diaphoresis, activity change, appetite change, fatigue and unexpected weight change.  HENT: Negative for congestion, rhinorrhea, sinus pressure, sneezing, sore throat and trouble swallowing.   Eyes: Negative for photophobia and visual disturbance.  Respiratory: Negative for cough, chest tightness, shortness of breath, wheezing and stridor.   Cardiovascular: Negative for chest pain, palpitations and leg swelling.  Gastrointestinal: Negative for nausea, vomiting, abdominal pain, diarrhea, constipation, blood in stool, abdominal distention and anal bleeding.  Genitourinary: Negative for dysuria, hematuria, flank pain and difficulty urinating.  Musculoskeletal: Negative for myalgias, back pain, joint swelling, arthralgias and gait problem.  Skin: Negative for color change, pallor, rash and wound.  Neurological: Negative for dizziness, tremors, weakness and light-headedness.  Hematological: Negative for adenopathy. Does not bruise/bleed easily.  Psychiatric/Behavioral: Negative for behavioral problems, confusion, sleep disturbance, dysphoric mood, decreased concentration and agitation.       Objective:   Physical Exam  Constitutional: He is oriented to person, place, and time. He appears well-developed and well-nourished. No distress.  HENT:  Head: Normocephalic and atraumatic.    Mouth/Throat: Oropharynx is clear and moist. No oropharyngeal exudate.  Eyes: Conjunctivae and EOM are normal. No scleral icterus.  Neck: Normal range of motion. Neck supple. No JVD present.  Cardiovascular: Normal rate and regular rhythm.   Pulmonary/Chest: Effort normal. No respiratory distress. He has no wheezes.  Abdominal: He exhibits no distension.  Musculoskeletal: He exhibits no edema or tenderness.  Neurological: He is alert and oriented to person, place, and time. He exhibits normal muscle tone. Coordination normal.  Skin: Skin is warm and dry. He is not diaphoretic. No erythema. No pallor.  Psychiatric: He has a normal mood and affect. His behavior is normal. Judgment and thought content normal.          Assessment & Plan:   #1 HIV Disease: Change to GENVOYA, renew ADAP rtc in 4 months. His Virus is well controlled. He continues to have trouble keeping CD4 up likely due to fact that he was diagnosed when he had AIDS and very low CD4   I spent greater than 25 minutes with the patient including greater than 50% of time in face to face counsel of the patient and in coordination of their care using Spanish translator   #2 elevated triglycerides continue fenofibrate  #3 Vaccinate: need to check for Hep B surface ab  #4 HSV 2: continue suppressive valtrex

## 2015-01-15 ENCOUNTER — Ambulatory Visit: Payer: Self-pay

## 2015-01-15 ENCOUNTER — Other Ambulatory Visit: Payer: Self-pay | Admitting: *Deleted

## 2015-01-15 DIAGNOSIS — B20 Human immunodeficiency virus [HIV] disease: Secondary | ICD-10-CM

## 2015-01-15 MED ORDER — ELVITEG-COBIC-EMTRICIT-TENOFAF 150-150-200-10 MG PO TABS: 1.0000 | ORAL_TABLET | Freq: Every day | ORAL | Status: DC

## 2015-04-30 ENCOUNTER — Other Ambulatory Visit: Payer: Self-pay

## 2015-05-14 ENCOUNTER — Ambulatory Visit: Payer: Self-pay | Admitting: Infectious Disease

## 2015-06-13 ENCOUNTER — Other Ambulatory Visit: Payer: Self-pay | Admitting: Internal Medicine

## 2015-10-17 ENCOUNTER — Other Ambulatory Visit: Payer: Self-pay | Admitting: Infectious Disease

## 2015-11-07 ENCOUNTER — Other Ambulatory Visit: Payer: Self-pay | Admitting: Infectious Disease

## 2015-12-07 ENCOUNTER — Other Ambulatory Visit: Payer: Self-pay | Admitting: Infectious Disease

## 2015-12-07 DIAGNOSIS — E781 Pure hyperglyceridemia: Secondary | ICD-10-CM

## 2015-12-07 NOTE — Telephone Encounter (Signed)
It is time for the patient to make appointments for lab work and to see the doctor.  No further refills until the patient is seen by the doctor.

## 2015-12-12 ENCOUNTER — Other Ambulatory Visit: Payer: Self-pay | Admitting: Internal Medicine

## 2016-01-07 ENCOUNTER — Other Ambulatory Visit: Payer: Self-pay | Admitting: Infectious Disease

## 2016-01-07 DIAGNOSIS — B20 Human immunodeficiency virus [HIV] disease: Secondary | ICD-10-CM

## 2016-01-10 ENCOUNTER — Other Ambulatory Visit: Payer: Self-pay | Admitting: Infectious Disease

## 2016-02-05 ENCOUNTER — Other Ambulatory Visit: Payer: Self-pay | Admitting: Internal Medicine

## 2016-03-04 ENCOUNTER — Telehealth: Payer: Self-pay | Admitting: *Deleted

## 2016-03-04 NOTE — Telephone Encounter (Signed)
uggh and  [facility]

## 2016-03-04 NOTE — Telephone Encounter (Signed)
[facility]  His ADAP has been pended, as his last labs are more than 46 year old (12/2014). RN left message instructing the patient to come in ASAP. Andree CossHowell, Danyia Borunda M, RN

## 2016-03-26 ENCOUNTER — Other Ambulatory Visit (INDEPENDENT_AMBULATORY_CARE_PROVIDER_SITE_OTHER): Payer: Self-pay

## 2016-03-26 DIAGNOSIS — Z79899 Other long term (current) drug therapy: Secondary | ICD-10-CM

## 2016-03-26 DIAGNOSIS — Z113 Encounter for screening for infections with a predominantly sexual mode of transmission: Secondary | ICD-10-CM

## 2016-03-26 DIAGNOSIS — B20 Human immunodeficiency virus [HIV] disease: Secondary | ICD-10-CM

## 2016-03-26 LAB — CBC WITH DIFFERENTIAL/PLATELET
BASOS ABS: 0 {cells}/uL (ref 0–200)
Basophils Relative: 0 %
EOS PCT: 3 %
Eosinophils Absolute: 153 cells/uL (ref 15–500)
HCT: 43.5 % (ref 38.5–50.0)
Hemoglobin: 15.1 g/dL (ref 13.2–17.1)
Lymphocytes Relative: 30 %
Lymphs Abs: 1530 cells/uL (ref 850–3900)
MCH: 33.7 pg — AB (ref 27.0–33.0)
MCHC: 34.7 g/dL (ref 32.0–36.0)
MCV: 97.1 fL (ref 80.0–100.0)
MONOS PCT: 12 %
MPV: 9.7 fL (ref 7.5–12.5)
Monocytes Absolute: 612 cells/uL (ref 200–950)
NEUTROS PCT: 55 %
Neutro Abs: 2805 cells/uL (ref 1500–7800)
PLATELETS: 333 10*3/uL (ref 140–400)
RBC: 4.48 MIL/uL (ref 4.20–5.80)
RDW: 12.9 % (ref 11.0–15.0)
WBC: 5.1 10*3/uL (ref 3.8–10.8)

## 2016-03-26 LAB — LIPID PANEL
Cholesterol: 189 mg/dL (ref 125–200)
HDL: 55 mg/dL (ref >=40)
LDL CALC: 98 mg/dL (ref <130)
TRIGLYCERIDES: 179 mg/dL — AB (ref <150)
Total CHOL/HDL Ratio: 3.4 Ratio (ref <=5.0)
VLDL: 36 mg/dL — AB (ref <30)

## 2016-03-26 LAB — COMPLETE METABOLIC PANEL WITH GFR
ALBUMIN: 4.2 g/dL (ref 3.6–5.1)
ALK PHOS: 41 U/L (ref 40–115)
ALT: 18 U/L (ref 9–46)
AST: 25 U/L (ref 10–40)
BUN: 14 mg/dL (ref 7–25)
CHLORIDE: 103 mmol/L (ref 98–110)
CO2: 29 mmol/L (ref 20–31)
Calcium: 9.7 mg/dL (ref 8.6–10.3)
Creat: 1.2 mg/dL (ref 0.60–1.35)
GFR, EST NON AFRICAN AMERICAN: 73 mL/min (ref >=60)
GFR, Est African American: 84 mL/min (ref >=60)
GLUCOSE: 67 mg/dL (ref 65–99)
POTASSIUM: 4.4 mmol/L (ref 3.5–5.3)
SODIUM: 140 mmol/L (ref 135–146)
Total Bilirubin: 0.4 mg/dL (ref 0.2–1.2)
Total Protein: 7.5 g/dL (ref 6.1–8.1)

## 2016-03-27 LAB — T-HELPER CELL (CD4) - (RCID CLINIC ONLY)
CD4 T CELL ABS: 280 /uL — AB (ref 400–2700)
CD4 T CELL HELPER: 20 % — AB (ref 33–55)

## 2016-03-27 LAB — RPR

## 2016-03-28 LAB — HIV-1 RNA QUANT-NO REFLEX-BLD

## 2016-04-09 ENCOUNTER — Encounter: Payer: Self-pay | Admitting: Infectious Disease

## 2016-04-09 ENCOUNTER — Ambulatory Visit (INDEPENDENT_AMBULATORY_CARE_PROVIDER_SITE_OTHER): Payer: Self-pay | Admitting: Infectious Disease

## 2016-04-09 ENCOUNTER — Ambulatory Visit: Payer: Self-pay | Admitting: Infectious Disease

## 2016-04-09 VITALS — BP 109/73 | HR 74 | Temp 97.7°F | Wt 161.0 lb

## 2016-04-09 DIAGNOSIS — B20 Human immunodeficiency virus [HIV] disease: Secondary | ICD-10-CM

## 2016-04-09 DIAGNOSIS — B59 Pneumocystosis: Secondary | ICD-10-CM

## 2016-04-09 DIAGNOSIS — E785 Hyperlipidemia, unspecified: Secondary | ICD-10-CM

## 2016-04-09 DIAGNOSIS — A6000 Herpesviral infection of urogenital system, unspecified: Secondary | ICD-10-CM

## 2016-04-09 DIAGNOSIS — Z21 Asymptomatic human immunodeficiency virus [HIV] infection status: Secondary | ICD-10-CM

## 2016-04-09 MED ORDER — ELVITEG-COBIC-EMTRICIT-TENOFAF 150-150-200-10 MG PO TABS: 1.0000 | ORAL_TABLET | Freq: Every day | ORAL | Status: AC

## 2016-04-09 MED ORDER — GABAPENTIN 300 MG PO CAPS: 300.0000 mg | ORAL_CAPSULE | Freq: Three times a day (TID) | ORAL | Status: DC

## 2016-04-09 NOTE — Progress Notes (Signed)
Subjective:    Patient ID: Gerald Cox, male    DOB: November 21, 1971, 45 y.o.   MRN: [id number]  HPI   Gerald Cox is a 45 y.o. male with HIV infection (with history of PCP pneumonia, typhoid fever)  who is doing superbly well on his  antiviral regimen, GENVOYA after having been on STRIBILD with undetectable viral load and health cd4 count.   Lab Results  Component Value Date   HIV1RNAQUANT <20 03/26/2016    Lab Results  Component Value Date   CD4TABS 280* 03/26/2016   CD4TABS 150* 12/28/2014   CD4TABS 120* 07/12/2014    He did run into problems with his recent medications not being filled. This may been due to his ADAP not being processed on time. He also not been seen in clinic for more than a year. I believe ADAP is now active if not we will get him into [facility] in the meantime he has substituted STRIBILD back for GENVOYA to bridge himself through the period of time where he has not had access to [facility].  Past Medical History  Diagnosis Date  . Typhoid fever     45 years old  . Laceration of hand with tendon involvement 2002  . Angina   . Pneumonia   . HIV infection [facility])     Past Surgical History  Procedure Laterality Date  . Other surgical history  10/27/11    per family pt was hospitilized due to cutting thigh with a chainsaw     Family History  Problem Relation Age of Onset  . Hypertension Sister   . Hypertension Mother       Social History   Social History  . Marital Status: Single    Spouse Name: N/A  . Number of Children: N/A  . Years of Education: N/A   Social History Main Topics  . Smoking status: Current Some Day Smoker -- 0.25 packs/day    Types: Cigarettes, Cigars  . Smokeless tobacco: Never Used     Comment: 5 to 6 cigarettes daily  . Alcohol Use: 8.4 oz/week    14 Cans of beer per week  . Drug Use: No  . Sexual Activity:    Partners: Female   Other Topics Concern  . None   Social History Narrative   Pt is  originally [place] - has been living in [place] for 7 years. He lives in [place] with his daughter-in-law.  Pt works as a Education administrator. He is married, but his wife still lives in [place].  He has not seen her in 5-6 years. He reports he has had 2-3 sexual encounters since arriving in the [place].      No Known Allergies   Current outpatient prescriptions:  .  elvitegravir-cobicistat-emtricitabine-tenofovir (GENVOYA) 150-150-200-10 MG TABS tablet, Take 1 tablet by mouth daily with breakfast., Disp: 30 tablet, Rfl: 11 .  fenofibrate (TRICOR) 145 MG tablet, TAKE 1 TABLET BY MOUTH DAILY, Disp: 30 tablet, Rfl: 1 .  valACYclovir (VALTREX) 1000 MG tablet, Take 1 tablet (1,000 mg total) by mouth daily., Disp: 30 tablet, Rfl: 11    Review of Systems  Constitutional: Negative for fever, chills, diaphoresis, activity change, appetite change, fatigue and unexpected weight change.  HENT: Negative for congestion, rhinorrhea, sinus pressure, sneezing, sore throat and trouble swallowing.   Eyes: Negative for photophobia and visual disturbance.  Respiratory: Negative for cough, chest tightness, shortness of breath, wheezing and stridor.   Cardiovascular: Negative for chest pain, palpitations and leg swelling.  Gastrointestinal:  Negative for nausea, vomiting, abdominal pain, diarrhea, constipation, blood in stool, abdominal distention and anal bleeding.  Genitourinary: Negative for dysuria, hematuria, flank pain and difficulty urinating.  Musculoskeletal: Negative for myalgias, back pain, joint swelling, arthralgias and gait problem.  Skin: Negative for color change, pallor, rash and wound.  Neurological: Negative for dizziness, tremors, weakness and light-headedness.  Hematological: Negative for adenopathy. Does not bruise/bleed easily.  Psychiatric/Behavioral: Negative for behavioral problems, confusion, sleep disturbance, dysphoric mood, decreased concentration and agitation.       Objective:   Physical Exam    Constitutional: He is oriented to person, place, and time. He appears well-developed and well-nourished. No distress.  HENT:  Head: Normocephalic and atraumatic.  Mouth/Throat: Oropharynx is clear and moist. No oropharyngeal exudate.  Eyes: Conjunctivae and EOM are normal. No scleral icterus.  Neck: Normal range of motion. Neck supple. No JVD present.  Cardiovascular: Normal rate and regular rhythm.   Pulmonary/Chest: Effort normal. No respiratory distress. He has no wheezes.  Abdominal: He exhibits no distension.  Musculoskeletal: He exhibits no edema or tenderness.  Neurological: He is alert and oriented to person, place, and time. He exhibits normal muscle tone. Coordination normal.  Skin: Skin is warm and dry. He is not diaphoretic. No erythema. No pallor.  Psychiatric: He has a normal mood and affect. His behavior is normal. Judgment and thought content normal.          Assessment & Plan:   #1 HIV Disease: continue GENVOYA, renew ADAP    #2 elevated triglycerides continue fenofibrate  #3 Vaccinate: need to check for Hep B surface ab  #4 HSV 2: continue suppressive valtrex

## 2016-04-22 ENCOUNTER — Ambulatory Visit: Payer: Self-pay | Admitting: Internal Medicine

## 2016-04-30 ENCOUNTER — Encounter: Payer: Self-pay | Admitting: Infectious Disease
